# Patient Record
Sex: Female | Born: 1970 | Race: Asian | Hispanic: No | Marital: Married | Smoking: Never smoker
Health system: Southern US, Community
[De-identification: ages and names within clinical notes are randomized; demographics above are authoritative.]

## PROBLEM LIST (undated history)

## (undated) ENCOUNTER — Inpatient Hospital Stay (HOSPITAL_COMMUNITY): Payer: Self-pay

## (undated) DIAGNOSIS — R51 Headache: Secondary | ICD-10-CM

## (undated) DIAGNOSIS — R519 Headache, unspecified: Secondary | ICD-10-CM

## (undated) DIAGNOSIS — G5601 Carpal tunnel syndrome, right upper limb: Secondary | ICD-10-CM

## (undated) DIAGNOSIS — G4700 Insomnia, unspecified: Secondary | ICD-10-CM

## (undated) DIAGNOSIS — I10 Essential (primary) hypertension: Secondary | ICD-10-CM

## (undated) DIAGNOSIS — Z789 Other specified health status: Secondary | ICD-10-CM

## (undated) DIAGNOSIS — T7491XA Unspecified adult maltreatment, confirmed, initial encounter: Secondary | ICD-10-CM

## (undated) HISTORY — DX: Unspecified adult maltreatment, confirmed, initial encounter: T74.91XA

## (undated) HISTORY — DX: Insomnia, unspecified: G47.00

## (undated) HISTORY — DX: Headache: R51

## (undated) HISTORY — DX: Carpal tunnel syndrome, right upper limb: G56.01

## (undated) HISTORY — PX: NO PAST SURGERIES: SHX2092

---

## 2010-12-22 ENCOUNTER — Other Ambulatory Visit (HOSPITAL_COMMUNITY): Payer: Self-pay | Admitting: Internal Medicine

## 2010-12-22 DIAGNOSIS — Z1231 Encounter for screening mammogram for malignant neoplasm of breast: Secondary | ICD-10-CM

## 2012-04-20 ENCOUNTER — Encounter (HOSPITAL_COMMUNITY): Payer: Self-pay | Admitting: Nurse Practitioner

## 2012-04-20 ENCOUNTER — Emergency Department (HOSPITAL_COMMUNITY)
Admission: EM | Admit: 2012-04-20 | Discharge: 2012-04-20 | Disposition: A | Discharge status: Home / Self Care | Payer: Medicaid Other | Attending: Emergency Medicine | Admitting: Emergency Medicine

## 2012-04-20 ENCOUNTER — Emergency Department (HOSPITAL_COMMUNITY): Payer: Medicaid Other

## 2012-04-20 DIAGNOSIS — O2390 Unspecified genitourinary tract infection in pregnancy, unspecified trimester: Secondary | ICD-10-CM | POA: Insufficient documentation

## 2012-04-20 DIAGNOSIS — R630 Anorexia: Secondary | ICD-10-CM | POA: Insufficient documentation

## 2012-04-20 DIAGNOSIS — R109 Unspecified abdominal pain: Secondary | ICD-10-CM | POA: Insufficient documentation

## 2012-04-20 DIAGNOSIS — R300 Dysuria: Secondary | ICD-10-CM | POA: Insufficient documentation

## 2012-04-20 DIAGNOSIS — R51 Headache: Secondary | ICD-10-CM | POA: Insufficient documentation

## 2012-04-20 DIAGNOSIS — O0289 Other abnormal products of conception: Secondary | ICD-10-CM | POA: Insufficient documentation

## 2012-04-20 DIAGNOSIS — O210 Mild hyperemesis gravidarum: Secondary | ICD-10-CM | POA: Insufficient documentation

## 2012-04-20 DIAGNOSIS — O020 Blighted ovum and nonhydatidiform mole: Secondary | ICD-10-CM

## 2012-04-20 DIAGNOSIS — N390 Urinary tract infection, site not specified: Secondary | ICD-10-CM | POA: Insufficient documentation

## 2012-04-20 LAB — URINALYSIS, MICROSCOPIC ONLY
Glucose, UA: NEGATIVE mg/dL
Ketones, ur: NEGATIVE mg/dL
pH: 6.5 (ref 5.0–8.0)

## 2012-04-20 LAB — CBC WITH DIFFERENTIAL/PLATELET
Basophils Absolute: 0 10*3/uL (ref 0.0–0.1)
Basophils Relative: 0 % (ref 0–1)
Eosinophils Absolute: 0.1 10*3/uL (ref 0.0–0.7)
Eosinophils Relative: 2 % (ref 0–5)
MCH: 26 pg (ref 26.0–34.0)
MCHC: 34.7 g/dL (ref 30.0–36.0)
MCV: 75 fL — ABNORMAL LOW (ref 78.0–100.0)
Platelets: 249 10*3/uL (ref 150–400)
RDW: 13 % (ref 11.5–15.5)
WBC: 6.3 10*3/uL (ref 4.0–10.5)

## 2012-04-20 LAB — COMPREHENSIVE METABOLIC PANEL
AST: 18 U/L (ref 0–37)
Albumin: 4 g/dL (ref 3.5–5.2)
Chloride: 102 mEq/L (ref 96–112)
Creatinine, Ser: 0.69 mg/dL (ref 0.50–1.10)
Potassium: 3.8 mEq/L (ref 3.5–5.1)
Sodium: 137 mEq/L (ref 135–145)
Total Bilirubin: 0.3 mg/dL (ref 0.3–1.2)

## 2012-04-20 LAB — POCT I-STAT TROPONIN I: Troponin i, poc: 0 ng/mL (ref 0.00–0.08)

## 2012-04-20 MED ORDER — CEPHALEXIN 500 MG PO CAPS: 500.0000 mg | ORAL_CAPSULE | Freq: Four times a day (QID) | ORAL | Status: DC

## 2012-04-20 NOTE — ED Provider Notes (Signed)
Patient signed out to me by Dr. Anitra Lauth. She presents today with some lower abdominal pain and has a positive pregnancy test. Ultrasound shows sac consistent with blighted ovum. Patient speaks a burmese dialect for which we are unable to obtain a Nurse, learning disability. Patient's care was translated through telephone translator to a neighbor of hers who then translated into her local dialect. Patient was informed of the likelihood of his carriage. She is given instructions for followup at [facility] hospital. Discharge diagnosis blighted ovum with imminent miscarriage and urinary tract infection  Hilario Quarry, MD 04/20/12 [phone]

## 2012-04-20 NOTE — ED Provider Notes (Signed)
History     CSN: [id number]  Arrival date & time 04/20/12  1147   First MD Initiated Contact with Patient 04/20/12 1302      Chief Complaint  Patient presents with  . Headache  . Abdominal Pain    (Consider location/radiation/quality/duration/timing/severity/associated sxs/prior treatment) Patient is a 42 y.o. female presenting with abdominal pain. The history is provided by a friend. The history is limited by a language barrier. A language interpreter was used.  Abdominal Pain Pain location:  Suprapubic, R flank and L flank Pain quality: aching, heavy and throbbing   Pain radiates to:  L flank and R flank Pain severity:  Moderate Onset quality:  Gradual Duration:  3 days Timing:  Constant Progression:  Worsening Chronicity:  New Context: recent sexual activity   Context: not recent illness   Context comment:  LMP was appx  NOv 12,2013 Relieved by:  Nothing Worsened by:  Nothing tried Ineffective treatments:  None tried Associated symptoms: anorexia, dysuria and nausea   Associated symptoms: no cough, no diarrhea, no fever, no vaginal bleeding, no vaginal discharge and no vomiting     History reviewed. No pertinent past medical history.  History reviewed. No pertinent past surgical history.  History reviewed. No pertinent family history.  History  Substance Use Topics  . Smoking status: Never Smoker   . Smokeless tobacco: Not on file  . Alcohol Use: Yes     Comment: occassional    OB History   Grav Para Term Preterm Abortions TAB SAB Ect Mult Living                  Review of Systems  Constitutional: Negative for fever.  Respiratory: Negative for cough.   Gastrointestinal: Positive for nausea, abdominal pain and anorexia. Negative for vomiting and diarrhea.  Genitourinary: Positive for dysuria. Negative for vaginal bleeding and vaginal discharge.  Neurological: Positive for headaches.       Intermittent HA for the last few months that are non-specific   All other systems reviewed and are negative.    Allergies  Review of patient's allergies indicates no known allergies.  Home Medications  No current outpatient prescriptions on file.  BP 134/85  Pulse 81  Temp(Src) 98.3 F (36.8 C) (Oral)  Resp 15  SpO2 100%  Physical Exam  Nursing note and vitals reviewed. Constitutional: She is oriented to person, place, and time. She appears well-developed and well-nourished. No distress.  HENT:  Head: Normocephalic and atraumatic.  Mouth/Throat: Oropharynx is clear and moist.  Eyes: Conjunctivae and EOM are normal. Pupils are equal, round, and reactive to light.  Neck: Normal range of motion. Neck supple.  Cardiovascular: Normal rate, regular rhythm and intact distal pulses.   No murmur heard. Pulmonary/Chest: Effort normal and breath sounds normal. No respiratory distress. She has no wheezes. She has no rales.  Abdominal: Soft. Normal appearance. She exhibits no distension. There is tenderness in the suprapubic area. There is CVA tenderness. There is no rebound and no guarding.  Genitourinary: Uterus is enlarged. Cervix exhibits no motion tenderness and no friability. Right adnexum displays no mass, no tenderness and no fullness. Left adnexum displays no mass, no tenderness and no fullness. There is bleeding around the vagina.  Musculoskeletal: Normal range of motion. She exhibits no edema and no tenderness.  Neurological: She is alert and oriented to person, place, and time.  Skin: Skin is warm and dry. No rash noted. No erythema.  Psychiatric: She has a normal mood and affect.  Her behavior is normal.    ED Course  Procedures (including critical care time)  Labs Reviewed  WET PREP, GENITAL - Abnormal; Notable for the following:    WBC, Wet Prep HPF POC MODERATE (*)    All other components within normal limits  CBC WITH DIFFERENTIAL - Abnormal; Notable for the following:    MCV 75.0 (*)    All other components within normal limits   URINALYSIS, MICROSCOPIC ONLY - Abnormal; Notable for the following:    APPearance CLOUDY (*)    Hgb urine dipstick LARGE (*)    Leukocytes, UA MODERATE (*)    All other components within normal limits  HCG, QUANTITATIVE, PREGNANCY - Abnormal; Notable for the following:    hCG, Beta Chain, Quant, S 9619 (*)    All other components within normal limits  POCT PREGNANCY, URINE - Abnormal; Notable for the following:    Preg Test, Ur POSITIVE (*)    All other components within normal limits  GC/CHLAMYDIA PROBE AMP  COMPREHENSIVE METABOLIC PANEL  LIPASE, BLOOD  POCT I-STAT TROPONIN I  ABO/RH   No results found.   No diagnosis found.    MDM   Patient is not speaking worse and giving history through a third party because she does not speak the language interpreter phone is speaking however decent history obtained through third party. Patient has been lightheaded upon standing with suprapubic and bilateral back pain that started 3 days ago. States her last menstrual period was approximately November 12. She denies any vaginal discharge or itching but is sexually active. On exam patient has mild bilateral flank pain and suprapubic pain. Your pregnancy test is positive at bedside ultrasound a gestational sac can be seen but no yolk sac or fetal pole. Gestational sac was measuring 8 weeks and her bladder was emptied. Will send her for formal transvaginal ultrasound. Urine also appears to be infected which is most likely urinary tract infection causing her symptoms. Otherwise hemoglobin is stable and electrolytes are normal. HCG is pending. Patient is having no bleeding and no prior known Rh incompatibility.  Pt with some vaginal bleeding on exam but no CMT or adnexal pain.  RH sent.  Pelvic U/S pending.  3:48 PM  U/S pending.  Checked out to Dr. Rosalia Hammers at 1600.     Gwyneth Sprout, MD 04/20/12 1549

## 2012-04-20 NOTE — ED Notes (Signed)
Pt currently in [place].

## 2012-04-20 NOTE — ED Notes (Signed)
C/o headaches, L flank pain, vomiting for 2 months.

## 2012-04-20 NOTE — ED Notes (Signed)
Contacted [employer] interpreters for language assistance.  Pt informed of D/c instruction.  No distress noted.  Resp symmetrical and unlabored.  Skin warm and dry.  Pt informed to follow up with [facility] hospital.  Instructed to [facility] if her bleeding increases to 2 soaked pads an hour, if she begins to have fevers or chills, or if her Abd pain increases.  Pt verbalizes understanding.

## 2012-04-20 NOTE — ED Notes (Signed)
Non english speaking, family states they speak ?[place]?

## 2012-04-21 LAB — ABO/RH: ABO/RH(D): A POS

## 2012-04-23 LAB — GC/CHLAMYDIA PROBE AMP
CT Probe RNA: NEGATIVE
GC Probe RNA: NEGATIVE

## 2012-05-15 ENCOUNTER — Inpatient Hospital Stay (HOSPITAL_COMMUNITY): Payer: Medicaid Other

## 2012-05-15 ENCOUNTER — Inpatient Hospital Stay (HOSPITAL_COMMUNITY)
Admission: AD | Admit: 2012-05-15 | Discharge: 2012-05-15 | Disposition: A | Discharge status: Home / Self Care | Payer: Medicaid Other | Source: Ambulatory Visit | Attending: Family Medicine | Admitting: Family Medicine

## 2012-05-15 ENCOUNTER — Encounter (HOSPITAL_COMMUNITY): Payer: Self-pay | Admitting: Family

## 2012-05-15 DIAGNOSIS — O034 Incomplete spontaneous abortion without complication: Secondary | ICD-10-CM | POA: Insufficient documentation

## 2012-05-15 HISTORY — DX: Other specified health status: Z78.9

## 2012-05-15 LAB — CBC
Platelets: 224 10*3/uL (ref 150–400)
RDW: 14.3 % (ref 11.5–15.5)
WBC: 6.9 10*3/uL (ref 4.0–10.5)

## 2012-05-15 MED ORDER — MISOPROSTOL 200 MCG PO TABS
800.0000 ug | ORAL_TABLET | Freq: Once | ORAL | Status: AC
  Administered 2012-05-15: 800 ug via VAGINAL
  Filled 2012-05-15: qty 4

## 2012-05-15 NOTE — MAU Note (Signed)
Chief Complaint:  No chief complaint on file.  MAU patient contact at 1300.   HPI: [name] is a 42 y.o. non-English speaker who presents to maternity admissions for a follow up from [facility] ED visit on 04/20/12. Was seen for abdominal pain and was found to have blighted ovum at [redacted] weeks gestation. Came here today because she has been experiencing vaginal bleeding at home and thinks she has passed the pregnancy.   Today, denies vaginal bleeding, abdominal pain, fever, chills, n/v, or diarrhea.   History obtained using phone translator    Past Medical History: Past Medical History  Diagnosis Date  . Medical history non-contributory     Past obstetric history: OB History   Grav Para Term Preterm Abortions TAB SAB Ect Mult Living   4              # Outc Date GA Lbr Len/2nd Wgt Sex Del Anes PTL Lv   1 GRA            2 GRA            3 GRA            4 CUR               Past Surgical History: Past Surgical History  Procedure Laterality Date  . No past surgeries      Family History: History reviewed. No pertinent family history.  Social History: History  Substance Use Topics  . Smoking status: Never Smoker   . Smokeless tobacco: Never Used  . Alcohol Use: Yes     Comment: occassional    Allergies: No Known Allergies  Meds:  Prescriptions prior to admission  Medication Sig Dispense Refill  . cephALEXin (KEFLEX) 500 MG capsule Take 1 capsule (500 mg total) by mouth 4 (four) times daily.  40 capsule  0    ROS: Pertinent findings in history of present illness.  Physical Exam  Blood pressure 113/81, pulse 109, temperature 98.2 F (36.8 C), temperature source Oral, resp. rate 16, height 5\' 2"  (1.575 m), weight 58.06 kg (128 lb), last menstrual period 01/30/2012, SpO2 100.00%. GENERAL: Well-developed, well-nourished female in no acute distress.  HEENT: normocephalic HEART: normal rate RESP: normal effort ABDOMEN: Soft, non-tender, gravid appropriate for gestational  age EXTREMITIES: Nontender, no edema NEURO: alert and oriented       Labs: Results for orders placed during the hospital encounter of 05/15/12 (from the past 24 hour(s))  HCG, QUANTITATIVE, PREGNANCY     Status: Abnormal   Collection Time    05/15/12 11:33 AM      Result Value Range   hCG, Beta Chain, Quant, S 344 (*) <5 mIU/mL  CBC     Status: Abnormal   Collection Time    05/15/12 11:34 AM      Result Value Range   WBC 6.9  4.0 - 10.5 K/uL   RBC 5.06  3.87 - 5.11 MIL/uL   Hemoglobin 12.9  12.0 - 15.0 g/dL   HCT 16.1  09.6 - 04.5 %   MCV 76.1 (*) 78.0 - 100.0 fL   MCH 25.5 (*) 26.0 - 34.0 pg   MCHC 33.5  30.0 - 36.0 g/dL   RDW 40.9  81.1 - 91.4 %   Platelets 224  150 - 400 K/uL    Imaging:    ED Course CBC and quant hcg drawn, U/S done.  Consulted with Dr Shawnie Pons  Assessment: 1. Incomplete miscarriage  Plan: Cytotec  PV while in MAU Follow up in GYN clinic in 2 weeks-appt made for pt for April 2nd at 1:15pm. Bleeding precautions Discharge home     Medication List    ASK your doctor about these medications       cephALEXin 500 MG capsule  Commonly known as:  KEFLEX  Take 1 capsule (500 mg total) by mouth 4 (four) times daily.        Wilber Oliphant, student midwife I supervised the student and agree with the exam and documentation.  Nolene Bernheim RN, FNP 05/15/2012 1:00 PM

## 2012-05-15 NOTE — MAU Note (Signed)
Patient was seen at [facility] ED on 2-22.

## 2012-05-15 NOTE — MAU Note (Signed)
Patient presents to MAU with c/o not feeling well after possible miscarriage on 3/6. States bleeding ended then and has not been feeling well since.  Reports nausea in mornings; denies current vaginal bleeding or discharge.

## 2012-05-15 NOTE — MAU Note (Signed)
[employer] translator [id number]. Patient states she had a miscarriage in January but was not confirmed by hospital . States a MD in a clinic confirmed pregnancy. Was unable to get to the hospital during that time. Since that time has not been feeling well. No pain or bleeding at this time.

## 2012-05-29 ENCOUNTER — Ambulatory Visit (INDEPENDENT_AMBULATORY_CARE_PROVIDER_SITE_OTHER): Payer: Medicaid Other | Admitting: Medical

## 2012-05-29 ENCOUNTER — Encounter: Payer: Self-pay | Admitting: Medical

## 2012-05-29 VITALS — BP 108/72 | HR 59 | Temp 98.7°F | Ht 62.0 in | Wt 130.2 lb

## 2012-05-29 DIAGNOSIS — O039 Complete or unspecified spontaneous abortion without complication: Secondary | ICD-10-CM

## 2012-05-29 NOTE — Patient Instructions (Signed)
Miscarriage  A miscarriage is the loss of an unborn baby (fetus) before the 20th week of pregnancy. The cause is often unknown.   HOME CARE   You may need to stay in bed (bed rest), or you may be able to do light activity. Go about activity as told by your doctor.   Have help at home.   Write down how many pads you use each day. Write down how soaked they are.   Do not use tampons. Do not wash out your vagina (douche) or have sex (intercourse) until your doctor approves.   Only take medicine as told by your doctor.   Do not take aspirin.   Keep all doctor visits as told.   If you or your partner have problems with grieving, talk to your doctor. You can also try counseling. Give yourself time to grieve before trying to get pregnant again.  GET HELP RIGHT AWAY IF:   You have bad cramps or pain in your back or belly (abdomen).   You have a fever.   You pass large clumps of blood (clots) from your vagina that are walnut-sized or larger. Save the clumps for your doctor to see.   You pass large amounts of tissue from your vagina. Save the tissue for your doctor to see.   You have more bleeding.   You have thick, bad-smelling fluid (discharge) coming from the vagina.   You get lightheaded, weak, or you pass out (faint).   You have chills.  MAKE SURE YOU:   Understand these instructions.   Will watch your condition.   Will get help right away if you are not doing well or get worse.  Document Released: 05/08/2011 Document Reviewed: 05/08/2011  [employer] Patient Information 2013 [employer].

## 2012-05-29 NOTE — Progress Notes (Signed)
Patient ID: [name], female   DOB: [birth year], 42 y.o.   MRN: [id number]  History:  [name]  is a 42 y.o. [phone] who presents to clinic today for follow-up after SAB. The patient was seen in MAU on 05/15/12 for SAB and given cytotec. She states that she did not have that much bleeding afterwards, but is still having just a little bit today. She is also still having a little bit of pelvic pain. She is not interested in birth control at this time as she states that "her husband doesn't like birth control."   The following portions of the patient's history were reviewed and updated as appropriate: allergies, current medications, past family history, past medical history, past social history, past surgical history and problem list.  Review of Systems:  Pertinent items are noted in HPI.  Objective:  Physical Exam BP 108/72  Pulse 59  Temp(Src) 98.7 F (37.1 C) (Oral)  Ht 5\' 2"  (1.575 m)  Wt 130 lb 3.2 oz (59.058 kg)  BMI 23.81 kg/m2  LMP 01/30/2012  Breastfeeding? Unknown GENERAL: Well-developed, well-nourished female in no acute distress.  HEENT: Normocephalic, atraumatic.   LUNGS: Normal rate. Clear to auscultation bilaterally.  HEART: Regular rate and rhythm with no adventitious sounds.  ABDOMEN: Soft, mild lower abdominal tenderness to palpation, nondistended. No organomegaly. Normal bowel sounds appreciated in all quadrants.  PELVIC: Normal external female genitalia. Vagina is pink and rugated.  Normal discharge and scant blood at the cervical os. Normal cervix contour. Uterus is normal in size. No adnexal masses. Mild tenderness to palpation of the left suprapubic region.  EXTREMITIES: No cyanosis, clubbing, or edema, 2+ distal pulses.  Labs and Imaging Quant hCG today  Assessment & Plan:  Assessment: SAB s/p Cytotec  Plans: Quant hCG today Patient cautioned about becoming pregnant too quickly, declines birth control.  Patient advised on the importance of condoms. Patient  voices understanding.  Patient will be called if quant hCG is not <5 for follow-up labs in one week. Otherwise patient may return PRN.

## 2012-05-30 ENCOUNTER — Telehealth: Payer: Self-pay | Admitting: *Deleted

## 2012-05-30 LAB — HCG, QUANTITATIVE, PREGNANCY: hCG, Beta Chain, Quant, S: 42.5 m[IU]/mL

## 2012-05-30 NOTE — Telephone Encounter (Signed)
Need to call patient with [facility] interpreter- needs next bhcg 06/05/12.

## 2012-05-30 NOTE — Telephone Encounter (Signed)
Called Kelia with [employer] [phone] and left a message we are calling- please call clinic.

## 2012-05-30 NOTE — Telephone Encounter (Signed)
Message copied by Gerome Apley on Thu May 30, 2012  4:01 PM ------      Message from: Freddi Starr      Created: Thu May 30, 2012  1:20 PM       Please inform patient that pregnancy hormone levels are not back to normal yet. She will need to return to clinic for labs only in 1 week. (repeat bhCG). Thanks! ------

## 2012-06-05 NOTE — Telephone Encounter (Signed)
Called patient with [employer]preter [phone] and left  A message we are calling from [facility] clinics and need to make an appointment- please call clinic.

## 2012-06-05 NOTE — Telephone Encounter (Signed)
Called pt with [facility] interpreter [phone]. Message was left that I am calling with test results and appt needed. Please call the clinic.

## 2012-06-10 NOTE — Telephone Encounter (Signed)
Called pt with [employer] interpreter # [phone] and left message that this is our fifth attempt to please give [place] a call at the clinics.   Sent certified letter.

## 2012-07-08 ENCOUNTER — Other Ambulatory Visit: Payer: Medicaid Other

## 2012-07-08 DIAGNOSIS — O039 Complete or unspecified spontaneous abortion without complication: Secondary | ICD-10-CM

## 2012-07-08 LAB — HCG, QUANTITATIVE, PREGNANCY: hCG, Beta Chain, Quant, S: <2 m[IU]/mL

## 2012-08-23 ENCOUNTER — Emergency Department (HOSPITAL_COMMUNITY)
Admission: EM | Admit: 2012-08-23 | Discharge: 2012-08-23 | Disposition: A | Discharge status: Home / Self Care | Payer: Medicaid Other | Attending: Emergency Medicine | Admitting: Emergency Medicine

## 2012-08-23 ENCOUNTER — Encounter (HOSPITAL_COMMUNITY): Payer: Self-pay | Admitting: Family Medicine

## 2012-08-23 DIAGNOSIS — L509 Urticaria, unspecified: Secondary | ICD-10-CM | POA: Insufficient documentation

## 2012-08-23 DIAGNOSIS — R109 Unspecified abdominal pain: Secondary | ICD-10-CM | POA: Insufficient documentation

## 2012-08-23 MED ORDER — ACETAMINOPHEN 325 MG PO TABS
975.0000 mg | ORAL_TABLET | Freq: Once | ORAL | Status: AC
  Administered 2012-08-23: 975 mg via ORAL
  Filled 2012-08-23: qty 3
  Filled 2012-08-23: qty 1

## 2012-08-23 MED ORDER — HYDROCORTISONE 1 % EX CREA: TOPICAL_CREAM | CUTANEOUS | Status: DC

## 2012-08-23 NOTE — ED Notes (Signed)
Per pt family she has rash all over her body that is itching and has been having abdominal pain and back pain. Denies N,V,D

## 2012-08-23 NOTE — ED Provider Notes (Signed)
History    CSN: [id number] Arrival date & time 08/23/12  1135  First MD Initiated Contact with Patient 08/23/12 1232     Chief Complaint  Patient presents with  . Pruritis  . Abdominal Pain   (Consider location/radiation/quality/duration/timing/severity/associated sxs/prior Treatment) HPI  42 y/o [place] Female from [place]. She speaks no english. Native language is Shanda Howells and [place] interpreter is used. The interpreter has difficulty translating. He states that he is having to ask the same question several times because the patient changes the subject or does not answer the question. 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 Patient cc is rash. She states it comes and goes. It is itchy and appears in different parts of her body at different times. The patient is unsure if she has changed her lotions, soaps or detergents. She does not know if anyone else in the house has the same rash.  She denies bedbugs or insects in the hourse. She denies swelling in the tongue, itching in the throat, wheezing or difficulty breathing. She has no known exacerbating factors.She has a slight headache at the moment. Denies photophobia, phonophobia, UL throbbing, N/V, visual changes, stiff neck, neck pain, rash, or "thunderclap" onset.    Past Medical History  Diagnosis Date  . Medical history non-contributory    Past Surgical History  Procedure Laterality Date  . No past surgeries     History reviewed. No pertinent family history. History  Substance Use Topics  . Smoking status: Never Smoker   . Smokeless tobacco: Never Used  . Alcohol Use: Yes     Comment: occassional   OB History   Grav Para Term Preterm Abortions TAB SAB Ect Mult Living   4 3 3       3      Review of Systems As stated in the HPI Allergies  Review of patient's allergies indicates no known allergies.  Home Medications  No current outpatient prescriptions on file. BP 115/85  Pulse 74  Temp(Src) 98.2 F  (36.8 C) (Oral)  Resp 16  SpO2 98%  LMP 01/30/2012 Physical Exam Physical Exam  Nursing note and vitals reviewed. Constitutional: She is oriented to person, place, and time. She appears well-developed and well-nourished. No distress.  HENT:  Head: Normocephalic and atraumatic.  Eyes: Conjunctivae normal and EOM are normal. Pupils are equal, round, and reactive to light. No scleral icterus.  Neck: Normal range of motion.  Cardiovascular: Normal rate, regular rhythm and normal heart sounds.  Exam reveals no gallop and no friction rub.   No murmur heard. Pulmonary/Chest: Effort normal and breath sounds normal. No respiratory distress.  Abdominal: Soft. Bowel sounds are normal. She exhibits no distension and no mass. There is no tenderness. There is no guarding.  Neurological: She is alert and oriented to person, place, and time.  Skin: Skin is warm and dry. She is not diaphoretic.  Urticaria of the inner thighs and legs. Appears to be in the places where her pants touch .   ED Course  Procedures (including critical care time) Labs Reviewed - No data to display No results found. 1. Hives     MDM  3:31 PM BP 115/85  Pulse 59  Temp(Src) 97.5 F (36.4 C) (Oral)  Resp 16  SpO2 99%  LMP 01/30/2012 Patient with hives. Mild headache treated with tylenol. No focal neuro deficits. Patient will be discharged with cortisone cream. I have written instructions for the patient to present to the pharmacist in order  to ensure that she gets dye free, perfume free products. The patient appears reasonably screened and/or stabilized for discharge and I doubt any other medical condition or other [facility] requiring further screening, evaluation, or treatment in the ED at this time prior to discharge.   Arthor Captain, PA-C 08/23/12 1543

## 2012-08-24 NOTE — ED Provider Notes (Signed)
Medical screening examination/treatment/procedure(s) were performed by non-physician practitioner and as supervising physician I was immediately available for consultation/collaboration.   Joya Gaskins, MD 08/24/12 [phone]

## 2013-03-11 ENCOUNTER — Emergency Department (HOSPITAL_COMMUNITY): Payer: Medicaid Other

## 2013-03-11 ENCOUNTER — Emergency Department (HOSPITAL_COMMUNITY)
Admission: EM | Admit: 2013-03-11 | Discharge: 2013-03-11 | Disposition: A | Discharge status: Home / Self Care | Payer: Medicaid Other | Attending: Emergency Medicine | Admitting: Emergency Medicine

## 2013-03-11 ENCOUNTER — Encounter (HOSPITAL_COMMUNITY): Payer: Self-pay | Admitting: Emergency Medicine

## 2013-03-11 DIAGNOSIS — S0993XA Unspecified injury of face, initial encounter: Secondary | ICD-10-CM | POA: Insufficient documentation

## 2013-03-11 DIAGNOSIS — Y9301 Activity, walking, marching and hiking: Secondary | ICD-10-CM | POA: Insufficient documentation

## 2013-03-11 DIAGNOSIS — S0990XA Unspecified injury of head, initial encounter: Secondary | ICD-10-CM | POA: Insufficient documentation

## 2013-03-11 DIAGNOSIS — S199XXA Unspecified injury of neck, initial encounter: Secondary | ICD-10-CM

## 2013-03-11 DIAGNOSIS — IMO0002 Reserved for concepts with insufficient information to code with codable children: Secondary | ICD-10-CM | POA: Insufficient documentation

## 2013-03-11 DIAGNOSIS — R002 Palpitations: Secondary | ICD-10-CM | POA: Insufficient documentation

## 2013-03-11 DIAGNOSIS — Z3202 Encounter for pregnancy test, result negative: Secondary | ICD-10-CM | POA: Insufficient documentation

## 2013-03-11 DIAGNOSIS — Y92410 Unspecified street and highway as the place of occurrence of the external cause: Secondary | ICD-10-CM | POA: Insufficient documentation

## 2013-03-11 DIAGNOSIS — S8392XA Sprain of unspecified site of left knee, initial encounter: Secondary | ICD-10-CM

## 2013-03-11 LAB — TROPONIN I
Troponin I: <0.3 ng/mL (ref <0.30)
Troponin I: <0.3 ng/mL (ref <0.30)

## 2013-03-11 LAB — POCT PREGNANCY, URINE: Preg Test, Ur: NEGATIVE

## 2013-03-11 MED ORDER — IBUPROFEN 800 MG PO TABS: 800.0000 mg | ORAL_TABLET | Freq: Three times a day (TID) | ORAL | Status: DC

## 2013-03-11 NOTE — Progress Notes (Signed)
Pt. Speaks no english, negative urine pregnancy test 03-11-13
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
## 2013-03-11 NOTE — Discharge Instructions (Signed)
Please call your doctor for a followup appointment within 24-48 hours. When you talk to your doctor please let them know that you were seen in the emergency department and have them acquire all of your records so that they can discuss the findings with you and formulate a treatment plan to fully care for your new and ongoing problems. Please call and set-up an appointment with neurosurgery and orthopedics to be re-evaluated - will need MRI in the near future if your back pain continues No broken bones on xrays or CT scans Please rest and stay hydrated Please take medications as prescribed Please avloid any physical or strenuous activity Please continue to monitor symptoms and if symptoms are to worsen or change (fever greater than 101, chills, sweating, chest pain, neck pain, shortness of breath, difficulty breathing, abdominal pain, numbness, tingling, fall, injury, inability to walk, headache, confusion, visual changes) please report back to the ED  Knee Pain Knee pain can be a result of an injury or other medical conditions. Treatment will depend on the cause of your pain. HOME CARE  Only take medicine as told by your doctor.  Keep a healthy weight. Being overweight can make the knee hurt more.  Stretch before exercising or playing sports.  If there is constant knee pain, change the way you exercise. Ask your doctor for advice.  Make sure shoes fit well. Choose the right shoe for the sport or activity.  Protect your knees. Wear kneepads if needed.  Rest when you are tired. GET HELP RIGHT AWAY IF:   Your knee pain does not stop.  Your knee pain does not get better.  Your knee joint feels hot to the touch.  You have a fever. MAKE SURE YOU:   Understand these instructions.  Will watch this condition.  Will get help right away if you are not doing well or get worse. Document Released: 05/12/2008 Document Revised: 05/08/2011 Document Reviewed: 05/12/2008 [facility] Patient  Information 2014 [employer] [place].   Emergency Department Resource Guide 1) Find a Doctor and Pay Out of Pocket Although you won't have to find out who is covered by your insurance plan, it is a good idea to ask around and get recommendations. You will then need to call the office and see if the doctor you have chosen will accept you as a new patient and what types of options they offer for patients who are self-pay. Some doctors offer discounts or will set up payment plans for their patients who do not have insurance, but you will need to ask so you aren't surprised when you get to your appointment.  2) Contact Your Local Health Department Not all health departments have doctors that can see patients for sick visits, but many do, so it is worth a call to see if yours does. If you don't know where your local health department is, you can check in your phone book. The CDC also has a tool to help you locate your state's health department, and many state websites also have listings of all of their local health departments.  3) Find a Walk-in Clinic If your illness is not likely to be very severe or complicated, you may want to try a walk in clinic. These are popping up all over the country in pharmacies, drugstores, and shopping centers. They're usually staffed by nurse practitioners or physician assistants that have been trained to treat common illnesses and complaints. They're usually fairly quick and inexpensive. However, if you have serious medical issues  or chronic medical problems, these are probably not your best option.  No Primary Care Doctor: - Call Health Connect at  [phone] - they can help you locate a primary care doctor that  accepts your insurance, provides certain services, etc. - Physician Referral Service- [phone]  Chronic Pain Problems: Organization         Address  Phone   Notes  [facility]  [phone] Patients need to be referred by their primary  care doctor.   Medication Assistance: Organization         Address  Phone   Notes  [facility] Medication [facility] [address] [place] [place], [place] [address] [phone] --Must be a resident of [facility] -- Must have NO insurance coverage whatsoever (no Medicaid/ Medicare, etc.) -- The pt. MUST have a primary care doctor that directs their care regularly and follows them in the community   [facility]ist  [phone]   [employer]  [phone]    Agencies that provide inexpensive medical care: Organization         Address  Phone   Notes  [facility]  [phone]   [facility]    [phone]   [facility] [address] [place], [place] [address] [phone]   [facility] [address] [place] [place] [phone]   Planned Parenthood    [phone]   [facility]    [phone]   [facility]  [address], [place] Phone:  [phone], Fax:  [phone] Hours of Operation:  9 am - 6 pm, M-F.  Also accepts Medicaid/Medicare and self-pay.  [facility]  [address], [place] Phone: [phone], Fax: [phone]. Hours of Operation:  8:30 am - 5:30 pm, M-F.  Also accepts Medicaid and self-pay.  [facility] [place] [address], [place] Phone: [phone]   [facility] [address] Natasha Bence [place], [place] [phone], Ext. [phone] Mondays & Thursdays: 7-9 AM.  First 15 patients are seen on a first come, first serve basis.    Medicaid-accepting [facility] Providers:  Organization         Address  Phone   Notes  [facility] [address], [place] [phone] Also accepts self-pay patients.  [facility] [address] Laurell Josephs [place], [place]  [phone]   [facility] [address], [place] [phone]   [facility] [address], [place] [phone]   Renaye Rakers [address], [place]   [phone] Only accepts [place] Access [place] patients after they have their name applied to their card.   Self-Pay (no insurance) in [facility]:  Organization         Address  Phone   Notes  Sickle Cell Patients, [facility] [address] [place], [place] [phone]   [facility] [address] [place], [place] [phone]   [facility] [place]  [address],  [phone]   [facility]/Dr. Osei-Bonsu  [address], [place] or [address], [place] [phone] Phone number for both [place] and [place] locations is the same.  [facility] [address], [place] [phone]   [facility] [place] [address] [place] [address], [place] or [address] [phone]) [phone]   [facility] [address], [place] [phone]) [phone], phone; [phone](336) [phone], fax Sees patients 1st and 3rd Saturday of every month.  Must not qualify for public or private insurance (i.e. Medicaid, Medicare, [place] Health Choice, Veterans' Benefits)  Household income should be no more than 200% of the poverty level The clinic cannot treat you if you are pregnant or think you are pregnant  Sexually transmitted diseases are not treated at the clinic.    Dental Care: Organization         Address  Phone  Notes  [facility] [facility] [address], [place] [phone] Accepts children up to age 44 who are enrolled in [place] or [place] Health Choice; pregnant women with a Medicaid card; and children who have applied for Medicaid or [place] Health Choice, but were declined, whose parents can pay a reduced fee at  time of service.  [facility] [place]  [address], [place] [phone] Accepts children up to age 44 who are enrolled in [place] or [place] Health Choice; pregnant women with a Medicaid card; and children who have applied for Medicaid or [place] Health Choice, but were declined, whose parents can pay a reduced fee at time of service.  [facility]  [address], [place] [phone]) [phone] Patients are seen by appointment only. Walk-ins are not accepted. [facility] will see patients 44 years of age and older. Monday - Tuesday (8am-5pm) Most Wednesdays (8:30-5pm) $30 per visit, cash only  [facility]  [address], [facility] [phone]) [phone] Patients are seen by appointment only. Walk-ins are not accepted. [facility] will see patients 44 years of age and older. One Wednesday Evening (Monthly: Volunteer Based).  $30 per visit, cash only  [employer] [facility]  [phone] for adults; Children under age 454, call Graduate Pediatric Dentistry at [phone]. Children aged 414-14, please call [phone] to request a pediatric application.  Dental services are provided in all areas of dental care including fillings, crowns and bridges, complete and partial dentures, implants, gum treatment, root canals, and extractions. Preventive care is also provided. Treatment is provided to both adults and children. Patients are selected via a lottery and there is often a waiting list.   [facility] [address], [place]  [phone] [email or web address]   [facility] [address], [place], [place] [phone], Ext. [phone] Second and Fourth Thursday of each month, opens at 6:30 AM; Clinic ends at 9 AM.  Patients are seen on a first-come first-served basis, and a limited number are seen during each clinic.   [facility]  [address], [place]  [place], [place] [phone]   Eligibility Requirements You must have lived in [place], [place], or [place] counties for at least the last three months.   You cannot be eligible for state or federal sponsored [place] Cityhealthcare insurance, including CIGNAVeterans Administration, [place]icaid, or [employer]Medicare.   You generally cannot be eligible for healthcare insurance through your employer.    How to apply: Eligibility screenings are held every Tuesday and Wednesday afternoon from 1:00 pm until 4:00 pm. You do not need an appointment for the interview!  [facility] [address], [place], [place] [phone]   [facility] Health Department  [phone]   [facility] Health [facility]  [phone]   [facility] Health Department  [phone]    Behavioral Health Resources in the Community: Intensive Outpatient Programs Organization         Address  Phone  Notes  [facility] Services [address], [place], [place] [phone]   [facility] Outpatient [address], [place], [place] [phone]   [facility] [address] [place], [place]  [phone]   [facility] [address]  [place], [place] [phone] or [phone]   Substance Abuse Resources Organization         Address  Phone  Notes  Al[facility] Drug Services  [phone]   [facility]  ([phone]   [facility] [place]  [phone]   Floydene Flock  [phone]   Residential & Outpatient Substance Abuse Program  [phone]   Psychological Services Organization         Address  Phone  Notes  [facility] Health  [phone]   [facility]  [phone]   [facility] [address], [place] [phone] or [phone]    Mobile Crisis Teams Organization         Address  Phone  Notes  [facility], Mobile Crisis Care Unit  [phone]   [facility] Psychotherapeutic  Services  [address] [place], [place] [phone]   Doristine Locks [address] [place] [phone]    Self-Help/Support Groups Organization         Address  Phone             Notes  [facility] [place] - variety of support groups  336- I7437963 Call for more information  Narcotics Anonymous (NA), [facility] [address], [employer] [place]  2 meetings at this location   Statistician         Address  Phone  Notes  [facility] [address],    [place]  [phone]   [facility]  [address], [place] [address], [place], [place] [phone]   [facility] [address] [place], [place] [phone] Admissions: 8am-3pm M-F  [facility] [address],    [place], [place] [phone]   [facility] [address] [place], [place], [place] [phone]   [facility] [address],  [place], [place] [phone]   [facility] - Intensive Outpatient [address] Laurell Joseph[address], [place], [place] [phone]   [facility] ([facility] [address] [place].,  [place], [place] [phone] or [phone]   [facility] ([facility]) [address] [place], [place] [phone] Accepts Medicaid  [facility] [address]  [place] [phone] Substance Abuse/Addiction Treatment   [facility] Organization         Address  Phone  Notes  [facility]  [phone]   Angie Fava, PhD [address] Ervin Knack [place], [place]   [phone] or [phone]   [facility]   [address] [place], [place] [phone]   [facility] [address], [place], [place] [phone] Insurance/Medicaid/sponsorship through [employer] and Families [address]  [place], [place] [phone] Therapy/tele-psych/case  °[facility] [address]  ° [place], [place] [phone]    °Dr. Arfeen  ([phone]   °[facility] [place]  [employer] [place] [facility] 1) [address], [place] °2) [address], [place] °3)  [address], [place] [phone]) [phone] °([phone]) [phone] ° °([phone]) [phone]   °[place] Child Abuse Hotline (336) 342-1394 or (336) [phone] (After Hours)    ° ° ° °

## 2013-03-11 NOTE — ED Notes (Signed)
Patient transported to CT 

## 2013-03-13 NOTE — ED Provider Notes (Signed)
Medical screening examination/treatment/procedure(s) were performed by non-physician practitioner and as supervising physician I was immediately available for consultation/collaboration.  EKG Interpretation    Date/Time:  Tuesday March 11 2013 10:49:14 EST Ventricular Rate:  74 PR Interval:  140 QRS Duration: 85 QT Interval:  383 QTC Calculation: 425 R Axis:   67 Text Interpretation:  Sinus rhythm non-specific t wave changes in V2-V3 Confirmed by Shaneque Merkle  MD, Anasia Agro ([id number]) on 03/11/2013 10:53:11 AM              Randa SpikeForrest Mort SawyersS Kimari Coudriet, MD 03/13/13 1316

## 2013-07-28 ENCOUNTER — Emergency Department (HOSPITAL_COMMUNITY): Payer: Medicaid Other

## 2013-07-28 ENCOUNTER — Emergency Department (HOSPITAL_COMMUNITY)
Admission: EM | Admit: 2013-07-28 | Discharge: 2013-07-28 | Disposition: A | Discharge status: Home / Self Care | Payer: Medicaid Other | Attending: Emergency Medicine | Admitting: Emergency Medicine

## 2013-07-28 ENCOUNTER — Encounter (HOSPITAL_COMMUNITY): Payer: Self-pay | Admitting: Emergency Medicine

## 2013-07-28 DIAGNOSIS — R42 Dizziness and giddiness: Secondary | ICD-10-CM | POA: Insufficient documentation

## 2013-07-28 DIAGNOSIS — O2390 Unspecified genitourinary tract infection in pregnancy, unspecified trimester: Secondary | ICD-10-CM | POA: Insufficient documentation

## 2013-07-28 DIAGNOSIS — R5383 Other fatigue: Secondary | ICD-10-CM

## 2013-07-28 DIAGNOSIS — N390 Urinary tract infection, site not specified: Secondary | ICD-10-CM | POA: Insufficient documentation

## 2013-07-28 DIAGNOSIS — R5381 Other malaise: Secondary | ICD-10-CM | POA: Insufficient documentation

## 2013-07-28 DIAGNOSIS — O2690 Pregnancy related conditions, unspecified, unspecified trimester: Secondary | ICD-10-CM | POA: Insufficient documentation

## 2013-07-28 DIAGNOSIS — O200 Threatened abortion: Secondary | ICD-10-CM | POA: Insufficient documentation

## 2013-07-28 DIAGNOSIS — O09529 Supervision of elderly multigravida, unspecified trimester: Secondary | ICD-10-CM | POA: Insufficient documentation

## 2013-07-28 HISTORY — DX: Headache, unspecified: R51.9

## 2013-07-28 HISTORY — DX: Headache: R51

## 2013-07-28 LAB — TYPE AND SCREEN
ABO/RH(D): A POS
ANTIBODY SCREEN: POSITIVE
DAT, IgG: NEGATIVE

## 2013-07-28 LAB — CBC WITH DIFFERENTIAL/PLATELET
Basophils Absolute: 0 10*3/uL (ref 0.0–0.1)
Basophils Relative: 0 % (ref 0–1)
EOS ABS: 0.2 10*3/uL (ref 0.0–0.7)
EOS PCT: 3 % (ref 0–5)
HCT: 36.2 % (ref 36.0–46.0)
HEMOGLOBIN: 12.4 g/dL (ref 12.0–15.0)
LYMPHS ABS: 1.3 10*3/uL (ref 0.7–4.0)
Lymphocytes Relative: 27 % (ref 12–46)
MCH: 26.2 pg (ref 26.0–34.0)
MCHC: 34.3 g/dL (ref 30.0–36.0)
MCV: 76.5 fL — AB (ref 78.0–100.0)
MONOS PCT: 7 % (ref 3–12)
Monocytes Absolute: 0.3 10*3/uL (ref 0.1–1.0)
Neutro Abs: 3.1 10*3/uL (ref 1.7–7.7)
Neutrophils Relative %: 63 % (ref 43–77)
Platelets: 187 10*3/uL (ref 150–400)
RBC: 4.73 MIL/uL (ref 3.87–5.11)
RDW: 13.1 % (ref 11.5–15.5)
WBC: 4.9 10*3/uL (ref 4.0–10.5)

## 2013-07-28 LAB — URINE MICROSCOPIC-ADD ON

## 2013-07-28 LAB — URINALYSIS, ROUTINE W REFLEX MICROSCOPIC
BILIRUBIN URINE: NEGATIVE
Glucose, UA: NEGATIVE mg/dL
Ketones, ur: NEGATIVE mg/dL
Nitrite: NEGATIVE
PH: 7 (ref 5.0–8.0)
Protein, ur: NEGATIVE mg/dL
SPECIFIC GRAVITY, URINE: 1.009 (ref 1.005–1.030)
Urobilinogen, UA: 0.2 mg/dL (ref 0.0–1.0)

## 2013-07-28 LAB — POC URINE PREG, ED: Preg Test, Ur: POSITIVE — AB

## 2013-07-28 LAB — COMPREHENSIVE METABOLIC PANEL
ALT: 17 U/L (ref 0–35)
AST: 20 U/L (ref 0–37)
Albumin: 3.7 g/dL (ref 3.5–5.2)
Alkaline Phosphatase: 29 U/L — ABNORMAL LOW (ref 39–117)
BUN: 10 mg/dL (ref 6–23)
CO2: 24 mEq/L (ref 19–32)
Calcium: 8.8 mg/dL (ref 8.4–10.5)
Chloride: 103 mEq/L (ref 96–112)
Creatinine, Ser: 0.83 mg/dL (ref 0.50–1.10)
GFR calc non Af Amer: 85 mL/min — ABNORMAL LOW (ref >90)
Glucose, Bld: 96 mg/dL (ref 70–99)
Potassium: 3.9 mEq/L (ref 3.7–5.3)
Sodium: 140 mEq/L (ref 137–147)
TOTAL PROTEIN: 7.4 g/dL (ref 6.0–8.3)
Total Bilirubin: 0.3 mg/dL (ref 0.3–1.2)

## 2013-07-28 LAB — RPR

## 2013-07-28 LAB — HCG, QUANTITATIVE, PREGNANCY: HCG, BETA CHAIN, QUANT, S: 1975 m[IU]/mL — AB (ref <5)

## 2013-07-28 LAB — I-STAT CG4 LACTIC ACID, ED: Lactic Acid, Venous: 1.73 mmol/L (ref 0.5–2.2)

## 2013-07-28 MED ORDER — NITROFURANTOIN MONOHYD MACRO 100 MG PO CAPS: 100.0000 mg | ORAL_CAPSULE | Freq: Two times a day (BID) | ORAL | Status: DC

## 2013-07-28 NOTE — ED Provider Notes (Signed)
I saw and evaluated the patient, reviewed the resident's note and I agree with the findings and plan.   EKG Interpretation None      Patient presents with left lower quadrant pain. Last period 2 months ago. Patient and she may be pregnant. Denies any vaginal bleeding. Hemodynamically stable without evidence of syncope.   Beta almost 2000.  [place] without ectopic but with irregular gestational sac.  May be evolving MC vs early pregnancy.  Bleeding with closed os on resident's pelvic exam. Patient needs repeat quant and [place].  Interpreter used to share diagnosis and followup plan with the patient.  Continues to be stable.  Will d/c with follow-up.  Shon Baton, MD 07/28/13 [phone]

## 2013-07-28 NOTE — ED Provider Notes (Signed)
I saw and evaluated the patient, reviewed the resident's note and I agree with the findings and plan.   EKG Interpretation None        Shon Baton, MD 07/28/13 1943

## 2013-07-28 NOTE — ED Provider Notes (Signed)
CSN: [id number]     Arrival date & time 07/28/13  3149 History   First MD Initiated Contact with Patient 07/28/13 1002     Chief Complaint  Patient presents with  . Abdominal Pain    2 months pregnant   . Weakness  . Dizziness     (Consider location/radiation/quality/duration/timing/severity/associated sxs/prior Treatment) Patient is a 43 y.o. female presenting with abdominal pain. The history is provided by the patient. The history is limited by a language barrier. A language interpreter was used.  Abdominal Pain Pain location:  Suprapubic and LLQ Pain quality: aching and dull   Pain radiates to:  Does not radiate Pain severity:  Moderate Onset quality:  Gradual Duration:  1 day Timing:  Constant Progression:  Unchanged Chronicity:  New Context: not alcohol use, not previous surgeries, not suspicious food intake and not trauma   Relieved by:  Nothing Worsened by:  Nothing tried Ineffective treatments:  None tried Associated symptoms: vaginal bleeding   Associated symptoms: no diarrhea, no fever, no hematuria and no vaginal discharge   Risk factors: pregnancy     Past Medical History  Diagnosis Date  . Medical history non-contributory   . Generalized headaches    Past Surgical History  Procedure Laterality Date  . No past surgeries     No family history on file. History  Substance Use Topics  . Smoking status: Never Smoker   . Smokeless tobacco: Never Used  . Alcohol Use: Yes     Comment: occassional   OB History   Grav Para Term Preterm Abortions TAB SAB Ect Mult Living   4 3 3       3      Review of Systems  Constitutional: Negative for fever.  Gastrointestinal: Positive for abdominal pain. Negative for diarrhea.  Genitourinary: Positive for vaginal bleeding. Negative for hematuria and vaginal discharge.  All other systems reviewed and are negative.     Allergies  Other  Home Medications   Prior to Admission medications   Medication Sig Start Date  End Date Taking? Authorizing Provider  ibuprofen (ADVIL,MOTRIN) 800 MG tablet Take 1 tablet (800 mg total) by mouth 3 (three) times daily. 03/11/13   Marissa Sciacca, PA-C   BP 108/74  Pulse 85  Temp(Src) 98.7 F (37.1 C) (Oral)  Resp 16  SpO2 100% Physical Exam  Constitutional: She is oriented to person, place, and time. She appears well-developed and well-nourished. No distress.  HENT:  Head: Normocephalic.  Eyes: Conjunctivae are normal.  Neck: Neck supple. No tracheal deviation present.  Cardiovascular: Normal rate and regular rhythm.   Pulmonary/Chest: Effort normal. No respiratory distress.  Abdominal: Soft. She exhibits no distension. There is tenderness in the suprapubic area. There is no rigidity, no rebound, no guarding, no CVA tenderness, no tenderness at McBurney's point and negative Murphy's sign.  Genitourinary: Cervix exhibits no motion tenderness, no discharge and no friability. Right adnexum displays no mass, no tenderness and no fullness. Left adnexum displays no mass, no tenderness and no fullness.  Scant dark blood at a closed os  Neurological: She is alert and oriented to person, place, and time.  Skin: Skin is warm and dry.  Psychiatric: She has a normal mood and affect.    ED Course  Procedures (including critical care time) Labs Review Labs Reviewed  URINALYSIS, ROUTINE W REFLEX MICROSCOPIC - Abnormal; Notable for the following:    APPearance TURBID (*)    Hgb urine dipstick LARGE (*)    Leukocytes, UA  LARGE (*)    All other components within normal limits  CBC WITH DIFFERENTIAL - Abnormal; Notable for the following:    MCV 76.5 (*)    All other components within normal limits  COMPREHENSIVE METABOLIC PANEL - Abnormal; Notable for the following:    Alkaline Phosphatase 29 (*)    GFR calc non Af Amer 85 (*)    All other components within normal limits  HCG, QUANTITATIVE, PREGNANCY - Abnormal; Notable for the following:    hCG, Beta Chain, Quant, S 1975  (*)    All other components within normal limits  URINE MICROSCOPIC-ADD ON - Abnormal; Notable for the following:    Squamous Epithelial / LPF MANY (*)    Bacteria, UA MANY (*)    All other components within normal limits  POC URINE PREG, ED - Abnormal; Notable for the following:    Preg Test, Ur POSITIVE (*)    All other components within normal limits  URINE CULTURE  GC/CHLAMYDIA PROBE AMP  RPR  I-STAT CG4 LACTIC ACID, ED  TYPE AND SCREEN    Imaging Review US Ob Comp Less 14 Wks  07/28/2013   CLINICAL DATA:  Miscarriage January 2015.  Positive beta HCG.  EXAM: OBSTETRIC <14 WK ULTRASOUND  TECHNIQUE: Transabdominal ultrasound was performed for evaluation of the gestation as well as the maternal uterus and adnexal regions.  COMPARISON:  None.  FINDINGS: Intrauterine gestational sac: Gestational sac is irregular.  Yolk sac:  Present.  Embryo:  None identified.  MSD: 23.2  mm   7 w   2  d  Maternal uterus/adnexae: Uterus and adnexa normal.  No free fluid.  IMPRESSION: Irregular gestational sac containing a fetal pole. Yolk sac appears to be present. Differential diagnosis includes an ovulatory pregnancy, missed abortion, and early intrauterine pregnancy. . Follow-up beta HCG and pelvic ultrasound suggested. Findings are suspicious but not yet definitive for failed pregnancy. Recommend follow-up US in 10-14 days for definitive diagnosis. This recommendation follows SRU consensus guidelines: Diagnostic Criteria for Nonviable Pregnancy Early in the First Trimester. Malva Limes Med 2013; 161:0960-45.   Electronically Signed   By: Maisie Fus  Register   On: 07/28/2013 14:21   US Ob Transvaginal  07/28/2013   CLINICAL DATA:  Miscarriage January 2015.  Positive beta HCG.  EXAM: OBSTETRIC <14 WK ULTRASOUND  TECHNIQUE: Transabdominal ultrasound was performed for evaluation of the gestation as well as the maternal uterus and adnexal regions.  COMPARISON:  None.  FINDINGS: Intrauterine gestational sac: Gestational  sac is irregular.  Yolk sac:  Present.  Embryo:  None identified.  MSD: 23.2  mm   7 w   2  d  Maternal uterus/adnexae: Uterus and adnexa normal.  No free fluid.  IMPRESSION: Irregular gestational sac containing a fetal pole. Yolk sac appears to be present. Differential diagnosis includes an ovulatory pregnancy, missed abortion, and early intrauterine pregnancy. . Follow-up beta HCG and pelvic ultrasound suggested. Findings are suspicious but not yet definitive for failed pregnancy. Recommend follow-up US in 10-14 days for definitive diagnosis. This recommendation follows SRU consensus guidelines: Diagnostic Criteria for Nonviable Pregnancy Early in the First Trimester. Malva Limes Med 2013; 409:8119-14.   Electronically Signed   By: Maisie Fus  Register   On: 07/28/2013 14:21     EKG Interpretation None      MDM   Final diagnoses:  Threatened abortion in early pregnancy  UTI (lower urinary tract infection)    44 y.o. [id number] presents with left lower abdominal pain  that started yesterday, has continued today. She admits to some vaginal bleeding, bleeding she is pregnant but has not taken the test. She has not had a period for the last 2 months. Her quantitative hCG level is suggestive of early or underdeveloped pregnancy but is above the threshold of visualization. Her urine is consistent with probable UTI and pyuria. Transvaginal ultrasound was ordered to evaluate location of her pregnancy and an irregular gestational sac containing a fetal pole was located but not consistent with viable pregnancy given her quant level. We recommended she followup in 48 hours for repeat level and she was advised that it is possible she could have a completed miscarriage between now and then. She was advised to return with any fevers, worsening abdominal pain, or other concerning symptoms. No evidence on ultrasound of extrauterine pregnancy location. She was prescribed Macrobid in the interim.  Lyndal Pulleyaniel Jennel Mara, MD 07/28/13  [phone]

## 2013-07-28 NOTE — Discharge Instructions (Signed)
Threatened Miscarriage °Bleeding during the first 20 weeks of pregnancy is common. This is sometimes called a threatened miscarriage. This is a pregnancy that is threatening to end before the twentieth week of pregnancy. Often this bleeding stops with bed rest or decreased activities as suggested by your caregiver and the pregnancy continues without any more problems. You may be asked to not have sexual intercourse, have orgasms or use tampons until further notice. Sometimes a threatened miscarriage can progress to a complete or incomplete miscarriage. This may or may not require further treatment. Some miscarriages occur before a woman misses a menstrual period and knows she is pregnant. °Miscarriages occur in 15 to 20% of all pregnancies and usually occur during the first 13 weeks of the pregnancy. The exact cause of a miscarriage is usually never known. A miscarriage is natures way of ending a pregnancy that is abnormal or would not make it to term. There are some things that may put you at risk to have a miscarriage, such as: °· Hormone problems. °· Infection of the uterus or cervix. °· Chronic illness, diabetes for example, especially if it is not controlled. °· Abnormal shaped uterus. °· Fibroids in the uterus. °· Incompetent cervix (the cervix is too weak to hold the baby). °· Smoking. °· Drinking too much alcohol. It's best not to drink any alcohol when you are pregnant. °· Taking illegal drugs. °TREATMENT  °When a miscarriage becomes complete and all products of conception (all the tissue in the uterus) have been passed, often no treatment is needed. If you think you passed tissue, save it in a container and take it to your doctor for evaluation. If the miscarriage is incomplete (parts of the fetus or placenta remain in the uterus), further treatment may be needed. The most common reason for further treatment is continued bleeding (hemorrhage) because pregnancy tissue did not pass out of the uterus. This  often occurs if a miscarriage is incomplete. Tissue left behind may also become infected. Treatment usually is dilatation and curettage (the removal of the remaining products of pregnancy. This can be done by a simple sucking procedure (suction curettage) or a simple scraping of the inside of the uterus. This may be done in the hospital or in the caregiver's office. This is only done when your caregiver knows that there is no chance for the pregnancy to proceed to term. This is determined by physical examination, negative pregnancy test, falling pregnancy hormone count and/or, an ultrasound revealing a dead fetus. °Miscarriages are often a very emotional time for prospective mothers and fathers. This is not you or your partners fault. It did not occur because of an inadequacy in you or your partner. Nearly all miscarriages occur because the pregnancy has started off wrongly. At least half of these pregnancies have a chromosomal abnormality. It is almost always not inherited. Others may have developmental problems with the fetus or placenta. This does not always show up even when the products miscarried are studied under the microscope. The miscarriage is nearly always not your fault and it is not likely that you could have prevented it from happening. If you are having emotional and grieving problems, talk to your health care provider and even seek counseling, if necessary, before getting pregnant again. You can begin trying for another pregnancy as soon as your caregiver says it is OK. °HOME CARE INSTRUCTIONS  °· Your caregiver may order bed rest depending on how much bleeding and cramping you are having. You may be limited   to only getting up to go to the bathroom. You may be allowed to continue light activity. You may need to make arrangements for the care of your other children and for any other responsibilities.  Keep track of the number of pads you use each day, how often you have to change pads and how  saturated (soaked) they are. Record this information.  DO NOT USE TAMPONS. Do not douche, have sexual intercourse or orgasms until approved by your caregiver.  You may receive a follow up appointment for re-evaluation of your pregnancy and a repeat blood test. Re-evaluation often occurs after 2 days and again in 4 to 6 weeks. It is very important that you follow-up in the recommended time period.  If you are Rh negative and the father is Rh positive or you do not know the fathers' blood type, you may receive a shot (Rh immune globulin) to help prevent abnormal antibodies that can develop and affect the baby in any future pregnancies. SEEK IMMEDIATE MEDICAL CARE IF:  You have severe cramps in your stomach, back, or abdomen.  You have a sudden onset of severe pain in the lower part of your abdomen.  You develop chills.  You run an unexplained temperature of 101 F (38.3 C) or higher.  You pass large clots or tissue. Save any tissue for your caregiver to inspect.  Your bleeding increases or you become light-headed, weak, or have fainting episodes.  You have a gush of fluid from your vagina.  You pass out. This could mean you have a tubal (ectopic) pregnancy. Document Released: 02/13/2005 Document Revised: 05/08/2011 Document Reviewed: 09/30/2007 [facility] Patient Information 2014 [place], [place]. Urinary Tract Infection Urinary tract infections (UTIs) can develop anywhere along your urinary tract. Your urinary tract is your body's drainage system for removing wastes and extra water. Your urinary tract includes two kidneys, two ureters, a bladder, and a urethra. Your kidneys are a pair of bean-shaped organs. Each kidney is about the size of your fist. They are located below your ribs, one on each side of your spine. CAUSES Infections are caused by microbes, which are microscopic organisms, including fungi, viruses, and bacteria. These organisms are so small that they can only be seen through  a microscope. Bacteria are the microbes that most commonly cause UTIs. SYMPTOMS  Symptoms of UTIs may vary by age and gender of the patient and by the location of the infection. Symptoms in young women typically include a frequent and intense urge to urinate and a painful, burning feeling in the bladder or urethra during urination. Older women and men are more likely to be tired, shaky, and weak and have muscle aches and abdominal pain. A fever may mean the infection is in your kidneys. Other symptoms of a kidney infection include pain in your back or sides below the ribs, nausea, and vomiting. DIAGNOSIS To diagnose a UTI, your caregiver will ask you about your symptoms. Your caregiver also will ask to provide a urine sample. The urine sample will be tested for bacteria and white blood cells. White blood cells are made by your body to help fight infection. TREATMENT  Typically, UTIs can be treated with medication. Because most UTIs are caused by a bacterial infection, they usually can be treated with the use of antibiotics. The choice of antibiotic and length of treatment depend on your symptoms and the type of bacteria causing your infection. HOME CARE INSTRUCTIONS  If you were prescribed antibiotics, take them exactly as your caregiver  instructs you. Finish the medication even if you feel better after you have only taken some of the medication.  Drink enough water and fluids to keep your urine clear or pale yellow.  Avoid caffeine, tea, and carbonated beverages. They tend to irritate your bladder.  Empty your bladder often. Avoid holding urine for long periods of time.  Empty your bladder before and after sexual intercourse.  After a bowel movement, women should cleanse from front to back. Use each tissue only once. SEEK MEDICAL CARE IF:   You have back pain.  You develop a fever.  Your symptoms do not begin to resolve within 3 days. SEEK IMMEDIATE MEDICAL CARE IF:   You have severe  back pain or lower abdominal pain.  You develop chills.  You have nausea or vomiting.  You have continued burning or discomfort with urination. MAKE SURE YOU:   Understand these instructions.  Will watch your condition.  Will get help right away if you are not doing well or get worse. Document Released: 11/23/2004 Document Revised: 08/15/2011 Document Reviewed: 03/24/2011 [facility] Patient Information 2014 [place].

## 2013-07-28 NOTE — ED Notes (Addendum)
Pt states she is [redacted] weeks pregnant.  Last night she began experiencing lower abdominal cramping, light bleeding, dizziness and weakness.  Translator phone used - pt speaks only [place] from [place].

## 2013-07-28 NOTE — ED Notes (Addendum)
Interpreter service used to help pt with her language [name] interpretation, pt states she is been having sharp pain on her left lower abd, she is been seen some dark brownish secretion on her under ware, pt states she thinks she is pregnant and she may been having a miscarriage. Pt denies any nausea, vomiting on diarrhea at this time.

## 2013-07-28 NOTE — ED Notes (Signed)
NOTIFIED YASHEMIA ,RN OF PATIENTS LAB RESULTS OF CG4+ LACTIC ACID ,@12 ///////////////:30 PM ,07/28/2013.

## 2013-07-29 LAB — URINE CULTURE: Colony Count: >=100000

## 2013-07-29 LAB — GC/CHLAMYDIA PROBE AMP
CT PROBE, AMP APTIMA: NEGATIVE
GC PROBE AMP APTIMA: NEGATIVE

## 2013-07-30 ENCOUNTER — Inpatient Hospital Stay (HOSPITAL_COMMUNITY)
Admission: AD | Admit: 2013-07-30 | Discharge: 2013-07-30 | Disposition: A | Discharge status: Home / Self Care | Payer: Medicaid Other | Source: Ambulatory Visit | Attending: Family Medicine | Admitting: Family Medicine

## 2013-07-30 DIAGNOSIS — R1032 Left lower quadrant pain: Secondary | ICD-10-CM | POA: Insufficient documentation

## 2013-07-30 DIAGNOSIS — O2390 Unspecified genitourinary tract infection in pregnancy, unspecified trimester: Secondary | ICD-10-CM | POA: Insufficient documentation

## 2013-07-30 DIAGNOSIS — N390 Urinary tract infection, site not specified: Secondary | ICD-10-CM | POA: Insufficient documentation

## 2013-07-30 DIAGNOSIS — O209 Hemorrhage in early pregnancy, unspecified: Secondary | ICD-10-CM

## 2013-07-30 NOTE — MAU Note (Signed)
Per PA, pt did not need to come for follow up blood work, pt will be scheduled for [place] in 7-10 days

## 2013-07-30 NOTE — MAU Provider Note (Signed)
[name] is a 43 y.o. [phone] at Unknown who presents to MAU today for follow-up. Patient was seen at [facility] on 07/28/13 for LLQ pain. She had +UPT and US showed irregular IUGS with YS at [redacted]w[redacted]d without embryo or cardiac activity. She states pain is mild and rates at 2/10 today in the LLQ. She is having spotting and not requiring a pad today. She denies fever. She states occasional N/V with one episode yesterday.   BP 102/80  Pulse 101  Temp(Src) 98.2 F (36.8 C) (Oral)  Resp 18  Wt 124 lb 12.8 oz (56.609 kg)  SpO2 99% GENERAL: Well-developed, well-nourished female in no acute distress.  HEENT: Normocephalic, atraumatic.   LUNGS: Normal effort HEART: Regular rate  ABDOMEN: Soft, nontender, nondistended. No organomegaly. No CVA tenderness EXTREMITIES: No cyanosis, clubbing, or edema  MDM Discussed with Dr. Shawnie Pons. Follow-up US in 7-10 days to confirm viability since already have confirmed IUP  A: IUGS and YS at [redacted]w[redacted]d Vaginal bleeding in pregnancy prior to [redacted] weeks gestation Abdominal pain in pregnancy UTI, currently on Macrobid  P: Discharge home Bleeding precautions discussed Tylenol recommended PRN pain Warning signs for Pyelonephritis discussed Patient scheduled for follow-up US on 08/06/13 @ 8:45 am Patient may return to MAU as needed or if her condition were to change or worsen  Freddi Starr, PA-C 07/30/2013 11:22 AM

## 2013-07-30 NOTE — MAU Provider Note (Signed)
Attestation of Attending Supervision of Advanced Practitioner (PA/CNM/NP): Evaluation and management procedures were performed by the Advanced Practitioner under my supervision and collaboration.  I have reviewed the Advanced Practitioner's note and chart, and I agree with the management and plan.  Reva Bores, MD [facility] Attending 07/30/2013 12:55 PM

## 2013-07-30 NOTE — Discharge Instructions (Signed)
Pelvic Rest Pelvic rest is sometimes recommended for women when:   The placenta is partially or completely covering the opening of the cervix (placenta previa).  There is bleeding between the uterine wall and the amniotic sac in the first trimester (subchorionic hemorrhage).  The cervix begins to open without labor starting (incompetent cervix, cervical insufficiency).  The labor is too early (preterm labor). HOME CARE INSTRUCTIONS  Do not have sexual intercourse, stimulation, or an orgasm.  Do not use tampons, douche, or put anything in the vagina.  Do not lift anything over 10 pounds (4.5 kg).  Avoid strenuous activity or straining your pelvic muscles. SEEK MEDICAL CARE IF:  You have any vaginal bleeding during pregnancy. Treat this as a potential emergency.  You have cramping pain felt low in the stomach (stronger than menstrual cramps).  You notice vaginal discharge (watery, mucus, or bloody).  You have a low, dull backache.  There are regular contractions or uterine tightening. SEEK IMMEDIATE MEDICAL CARE IF: You have vaginal bleeding and have placenta previa.  Document Released: 06/10/2010 Document Revised: 05/08/2011 Document Reviewed: 06/10/2010 [facility] Patient Information 2014 [place], [place].  Threatened Miscarriage Bleeding during the first 20 weeks of pregnancy is common. This is sometimes called a threatened miscarriage. This is a pregnancy that is threatening to end before the twentieth week of pregnancy. Often this bleeding stops with bed rest or decreased activities as suggested by your caregiver and the pregnancy continues without any more problems. You may be asked to not have sexual intercourse, have orgasms or use tampons until further notice. Sometimes a threatened miscarriage can progress to a complete or incomplete miscarriage. This may or may not require further treatment. Some miscarriages occur before a woman misses a menstrual period and knows she is  pregnant. Miscarriages occur in 15 to 20% of all pregnancies and usually occur during the first 13 weeks of the pregnancy. The exact cause of a miscarriage is usually never known. A miscarriage is natures way of ending a pregnancy that is abnormal or would not make it to term. There are some things that may put you at risk to have a miscarriage, such as:  Hormone problems.  Infection of the uterus or cervix.  Chronic illness, diabetes for example, especially if it is not controlled.  Abnormal shaped uterus.  Fibroids in the uterus.  Incompetent cervix (the cervix is too weak to hold the baby).  Smoking.  Drinking too much alcohol. It's best not to drink any alcohol when you are pregnant.  Taking illegal drugs. TREATMENT  When a miscarriage becomes complete and all products of conception (all the tissue in the uterus) have been passed, often no treatment is needed. If you think you passed tissue, save it in a container and take it to your doctor for evaluation. If the miscarriage is incomplete (parts of the fetus or placenta remain in the uterus), further treatment may be needed. The most common reason for further treatment is continued bleeding (hemorrhage) because pregnancy tissue did not pass out of the uterus. This often occurs if a miscarriage is incomplete. Tissue left behind may also become infected. Treatment usually is dilatation and curettage (the removal of the remaining products of pregnancy. This can be done by a simple sucking procedure (suction curettage) or a simple scraping of the inside of the uterus. This may be done in the hospital or in the caregiver's office. This is only done when your caregiver knows that there is no chance for the pregnancy to  proceed to term. This is determined by physical examination, negative pregnancy test, falling pregnancy hormone count and/or, an ultrasound revealing a dead fetus. Miscarriages are often a very emotional time for prospective  mothers and fathers. This is not you or your partners fault. It did not occur because of an inadequacy in you or your partner. Nearly all miscarriages occur because the pregnancy has started off wrongly. At least half of these pregnancies have a chromosomal abnormality. It is almost always not inherited. Others may have developmental problems with the fetus or placenta. This does not always show up even when the products miscarried are studied under the microscope. The miscarriage is nearly always not your fault and it is not likely that you could have prevented it from happening. If you are having emotional and grieving problems, talk to your health care provider and even seek counseling, if necessary, before getting pregnant again. You can begin trying for another pregnancy as soon as your caregiver says it is OK. HOME CARE INSTRUCTIONS   Your caregiver may order bed rest depending on how much bleeding and cramping you are having. You may be limited to only getting up to go to the bathroom. You may be allowed to continue light activity. You may need to make arrangements for the care of your other children and for any other responsibilities.  Keep track of the number of pads you use each day, how often you have to change pads and how saturated (soaked) they are. Record this information.  DO NOT USE TAMPONS. Do not douche, have sexual intercourse or orgasms until approved by your caregiver.  You may receive a follow up appointment for re-evaluation of your pregnancy and a repeat blood test. Re-evaluation often occurs after 2 days and again in 4 to 6 weeks. It is very important that you follow-up in the recommended time period.  If you are Rh negative and the father is Rh positive or you do not know the fathers' blood type, you may receive a shot (Rh immune globulin) to help prevent abnormal antibodies that can develop and affect the baby in any future pregnancies. SEEK IMMEDIATE MEDICAL CARE IF:  You  have severe cramps in your stomach, back, or abdomen.  You have a sudden onset of severe pain in the lower part of your abdomen.  You develop chills.  You run an unexplained temperature of 101 F (38.3 C) or higher.  You pass large clots or tissue. Save any tissue for your caregiver to inspect.  Your bleeding increases or you become light-headed, weak, or have fainting episodes.  You have a gush of fluid from your vagina.  You pass out. This could mean you have a tubal (ectopic) pregnancy. Document Released: 02/13/2005 Document Revised: 05/08/2011 Document Reviewed: 09/30/2007 [facility] Patient Information 2014 [place], [place].

## 2013-08-06 ENCOUNTER — Ambulatory Visit (HOSPITAL_COMMUNITY)
Admit: 2013-08-06 | Discharge: 2013-08-06 | Disposition: A | Discharge status: Home / Self Care | Payer: Medicaid Other | Attending: Medical | Admitting: Medical

## 2013-08-06 DIAGNOSIS — O209 Hemorrhage in early pregnancy, unspecified: Secondary | ICD-10-CM

## 2013-08-06 DIAGNOSIS — O200 Threatened abortion: Secondary | ICD-10-CM | POA: Insufficient documentation

## 2013-08-07 ENCOUNTER — Encounter: Payer: Self-pay | Admitting: Obstetrics and Gynecology

## 2013-08-07 ENCOUNTER — Ambulatory Visit (INDEPENDENT_AMBULATORY_CARE_PROVIDER_SITE_OTHER): Payer: Medicaid Other | Admitting: Obstetrics and Gynecology

## 2013-08-07 VITALS — BP 113/84 | HR 98 | Wt 121.0 lb

## 2013-08-07 DIAGNOSIS — O021 Missed abortion: Secondary | ICD-10-CM

## 2013-08-07 MED ORDER — MISOPROSTOL 200 MCG PO TABS: ORAL_TABLET | ORAL | Status: DC

## 2013-08-07 NOTE — Progress Notes (Signed)
Patient ID: [name], female   DOB: 1970-03-28, 43 y.o.   MRN: [id number] 32 yp G4P3 diagnosed with a failed pregnancy yesterday presenting today to discuss ultrasound results and management plan. Ultrasound results reviewed with the patient  6/10 ultrasound FINDINGS:  Intrauterine gestational sac: Visualized/normal in shape.  Yolk sac: Suspected  Embryo: Not visualized  MSD: 34 mm 8 w 6 d  Maternal uterus/adnexae: No subchorionic hemorrhage.  Bilateral ovaries are within normal limits.  No free fluid.  IMPRESSION:  Intrauterine gestational sac with suspected yolk sac but no fetal  pole. Mean sac diameter 34 mm.  Findings meet definitive criteria for failed pregnancy.  This follows SRU consensus guidelines: Diagnostic Criteria for  Nonviable Pregnancy Early in the First Trimester. [facility]  [id number].  Electronically Signed  By: Charline Bills M.D.  On: 08/06/2013 09:50  Patient understands that she has a failed pregnancy. Expectant, medical and surgical management options discussed. Patient opted for medical management with cytotec. RTC in 2 weeks for follow up.  This was a desired pregnancy and patient would like to conceive again.

## 2013-08-22 ENCOUNTER — Ambulatory Visit (INDEPENDENT_AMBULATORY_CARE_PROVIDER_SITE_OTHER): Payer: Medicaid Other | Admitting: Obstetrics and Gynecology

## 2013-08-22 ENCOUNTER — Encounter: Payer: Self-pay | Admitting: Obstetrics and Gynecology

## 2013-08-22 VITALS — BP 112/79 | HR 92 | Temp 97.3°F | Ht 62.0 in | Wt 119.8 lb

## 2013-08-22 DIAGNOSIS — O021 Missed abortion: Secondary | ICD-10-CM

## 2013-08-22 LAB — HCG, QUANTITATIVE, PREGNANCY: HCG, BETA CHAIN, QUANT, S: 94.8 m[IU]/mL

## 2013-08-22 MED ORDER — MISOPROSTOL 200 MCG PO TABS: ORAL_TABLET | ORAL | Status: DC

## 2013-08-22 NOTE — Progress Notes (Signed)
Patient ID: [name], female   DOB: 01/15/1971, 43 y.o.   MRN: [id number] 43 yo G3P3 with missed ab seen on 6/11 and opted for medical management with cytotec. Patient did not pick up prescription of cytotec and returns today. She denies any cramping or bleeding.  GENERAL: Well-developed, well-nourished female in no acute distress.  ABDOMEN: Soft, nontender, nondistended. No organomegaly. PELVIC: Normal external female genitalia. Vagina is pink and rugated.  Normal discharge. Normal appearing cervix. No blood in vault. Uterus is normal in size. No adnexal mass or tenderness. EXTREMITIES: No cyanosis, clubbing, or edema, 2+ distal pulses.  New prescription was provided today.  Will draw a quant HCG RTC in 2 weeks

## 2013-09-04 ENCOUNTER — Encounter: Payer: Self-pay | Admitting: Obstetrics and Gynecology

## 2013-09-04 ENCOUNTER — Ambulatory Visit (INDEPENDENT_AMBULATORY_CARE_PROVIDER_SITE_OTHER): Payer: Medicaid Other | Admitting: Obstetrics and Gynecology

## 2013-09-04 VITALS — BP 110/79 | HR 72 | Temp 97.3°F | Ht 62.0 in | Wt 120.3 lb

## 2013-09-04 DIAGNOSIS — O021 Missed abortion: Secondary | ICD-10-CM

## 2013-09-04 NOTE — Progress Notes (Signed)
Patient ID: [name], female   DOB: 10/09/1970, 43 y.o.   MRN: [id number] 43 yo [id number] diagnosed with a missed abortion s/p medical management with cytotec. Patient reports a few days of vaginal bleeding with cramping pain.  GENERAL: Well-developed, well-nourished female in no acute distress.  ABDOMEN: Soft, nontender, nondistended. No organomegaly. PELVIC: Normal external female genitalia. Vagina is pink and rugated.  Normal discharge. Normal appearing cervix. Uterus is normal in size. No adnexal mass or tenderness. EXTREMITIES: No cyanosis, clubbing, or edema, 2+ distal pulses.  A/P 43 yo with missed abortion s/p cytotec - will repeat quant HCG - Advised patient to abstain until this pregnancy is resolved. Her husband would like to have another child - patient will be contacted for repeat weekly quant HCG

## 2013-09-05 LAB — HCG, QUANTITATIVE, PREGNANCY: hCG, Beta Chain, Quant, S: 24.1 m[IU]/mL

## 2013-09-08 ENCOUNTER — Telehealth: Payer: Self-pay

## 2013-09-08 NOTE — Telephone Encounter (Signed)
Attempted to call patient with pacific interpreter id# [phone]. Called both home and mobile number-- no answer. Left message stating we are calling with results and to inform you of a scheduled lab appointment 09/11/13 (friday) at 0900, please call clinic.

## 2013-09-08 NOTE — Telephone Encounter (Signed)
Message copied by Louanna RawAMPBELL, Monna Crean M on Mon Sep 08, 2013  4:43 PM ------      Message from: CONSTANT, PEGGY      Created: Mon Sep 08, 2013  1:29 PM       Please inform patient of decreasing quant HCG. Patient needs to return for quant HCG only in 1 week ------

## 2013-09-10 NOTE — Telephone Encounter (Signed)
Called patient with pacific interpreter [phone], no answer on home number- left message that we are trying to reach you about an appt, please call us back

## 2013-09-11 ENCOUNTER — Other Ambulatory Visit: Payer: Medicaid Other

## 2013-09-11 DIAGNOSIS — N939 Abnormal uterine and vaginal bleeding, unspecified: Secondary | ICD-10-CM

## 2013-09-11 NOTE — Telephone Encounter (Signed)
Pt at clinic for appointment on 7/16.

## 2013-09-12 LAB — HCG, QUANTITATIVE, PREGNANCY: HCG, BETA CHAIN, QUANT, S: 3.8 m[IU]/mL

## 2013-09-24 ENCOUNTER — Ambulatory Visit: Payer: Medicaid Other | Admitting: Obstetrics & Gynecology

## 2013-12-29 ENCOUNTER — Encounter: Payer: Self-pay | Admitting: Obstetrics and Gynecology

## 2014-04-26 IMAGING — US US OB TRANSVAGINAL
1 series · 13 of 28 positions shown · non-contrast
Comparison: None.

CLINICAL DATA: Abdominal pain

OBSTETRIC <14 WK US AND TRANSVAGINAL OB US
TECHNIQUE: Both transabdominal and transvaginal ultrasound
examinations were performed for complete evaluation of the
gestation as well as the maternal uterus, adnexal regions, and
pelvic cul-de-sac.  Transvaginal technique was performed to assess
early pregnancy.

[Series 1: us ob transvaginal · 0.23mm/px · 13 of 64 slices shown]
[im 3/64]
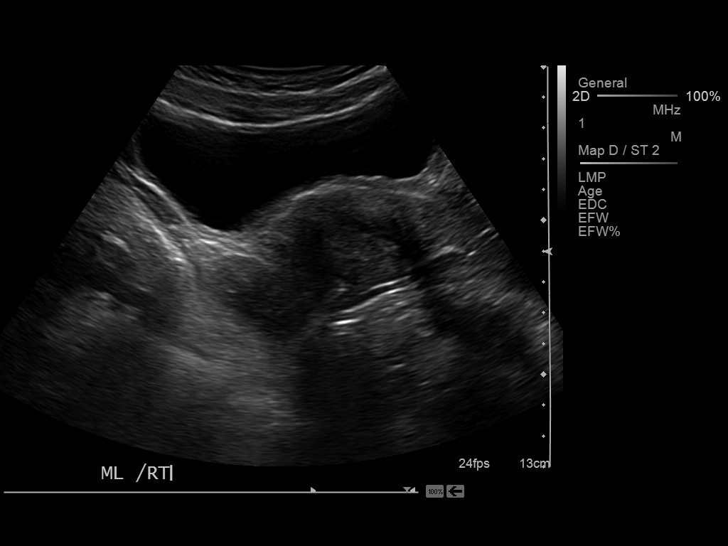
[im 8/64]
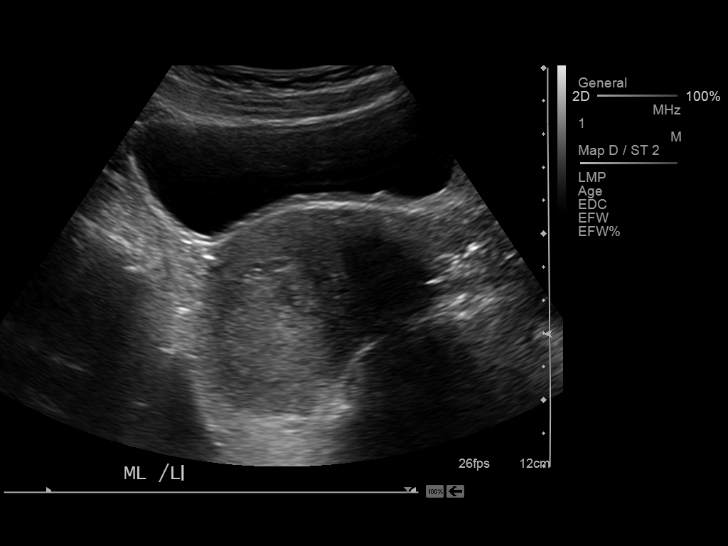
[im 12/64]
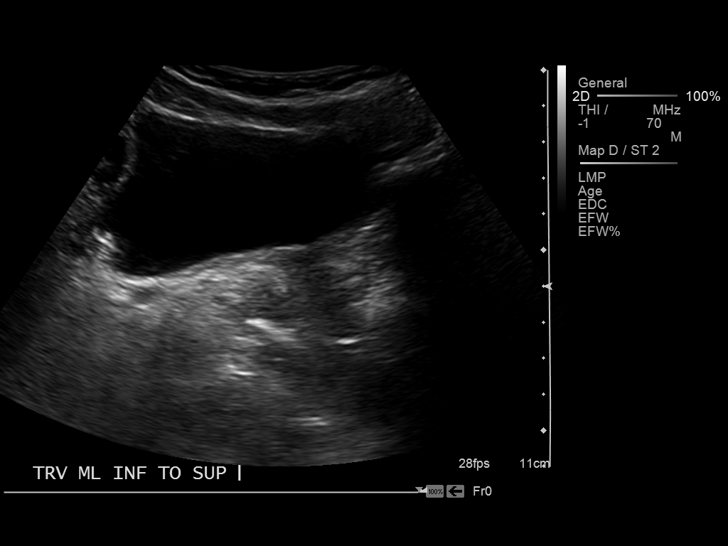
[im 17/64]
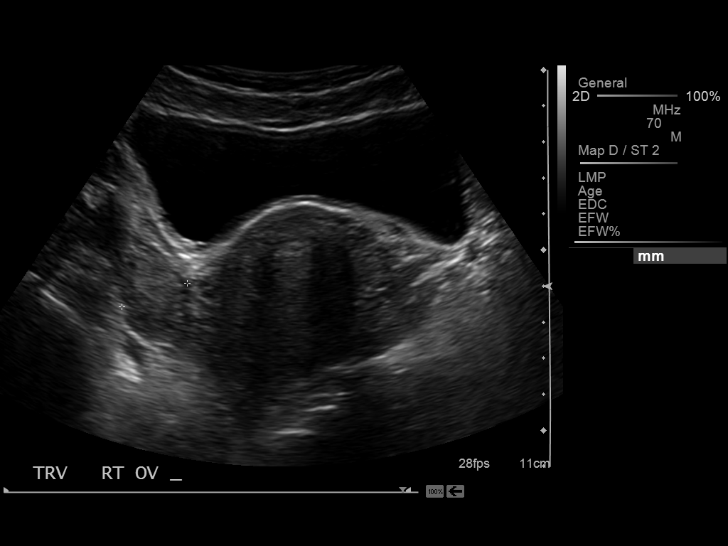
[im 22/64]
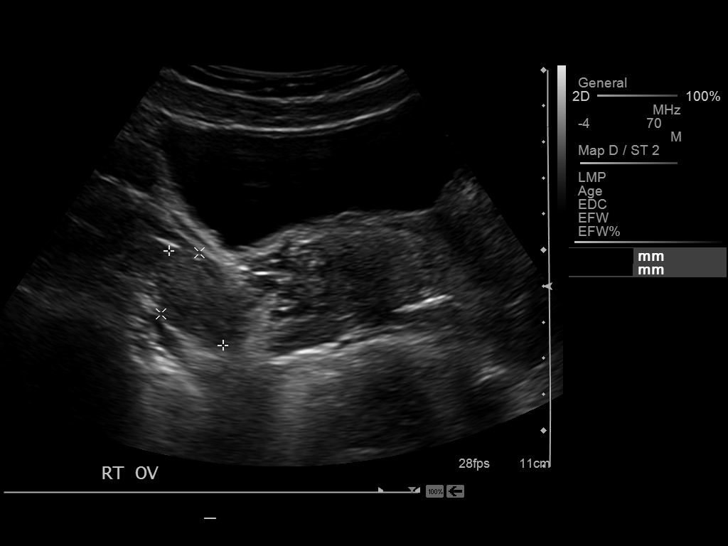
[im 26/64]
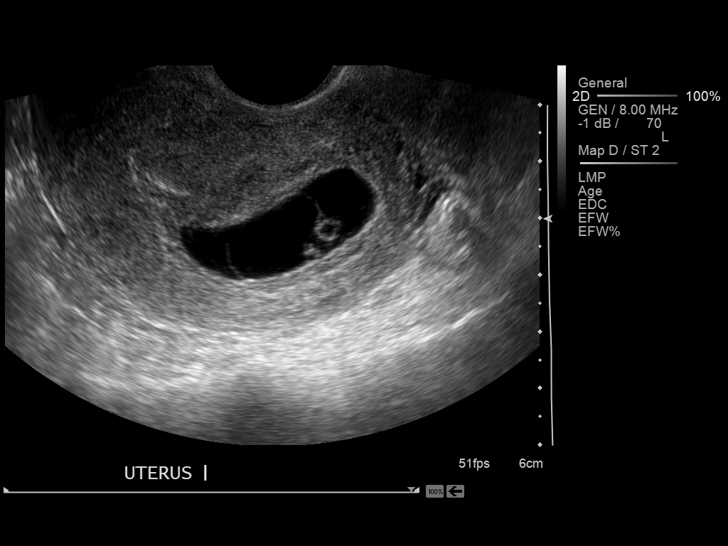
[im 33/64]
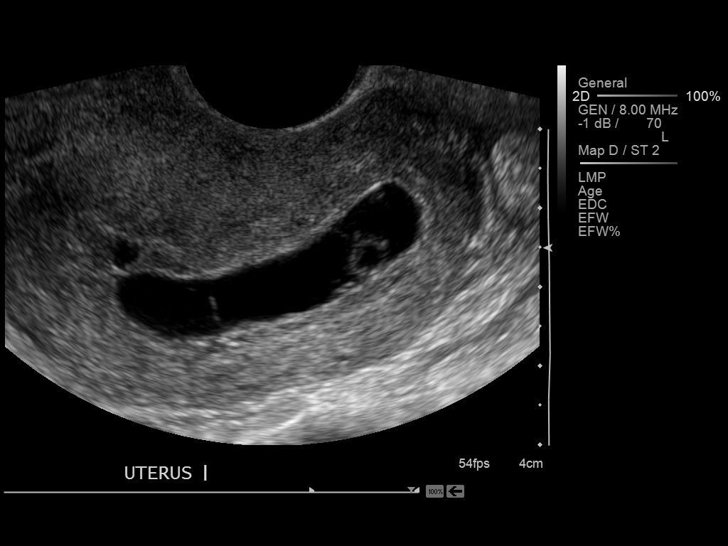
[im 38/64]
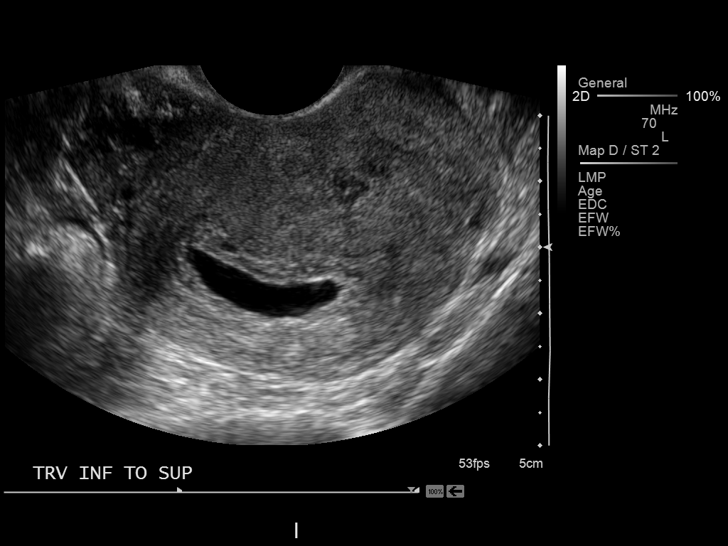
[im 43/64]
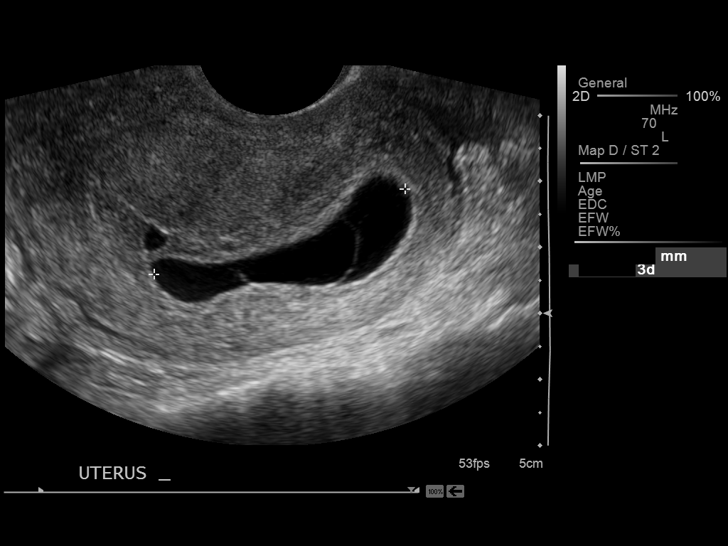
[im 47/64]
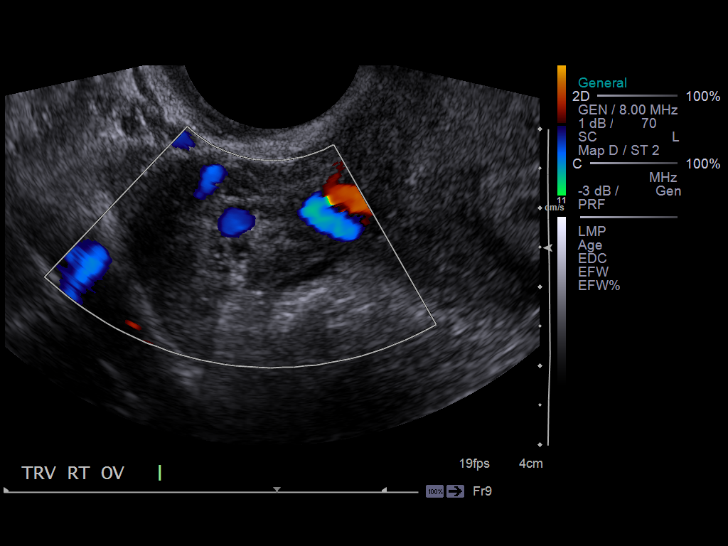
[im 52/64]
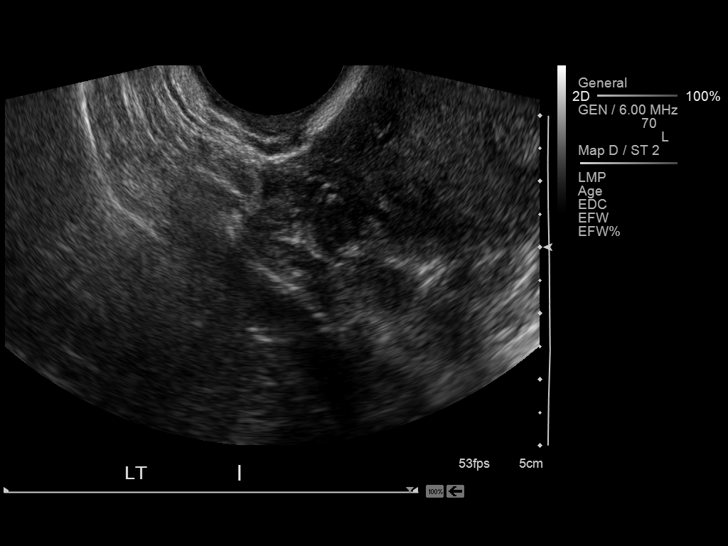
[im 57/64]
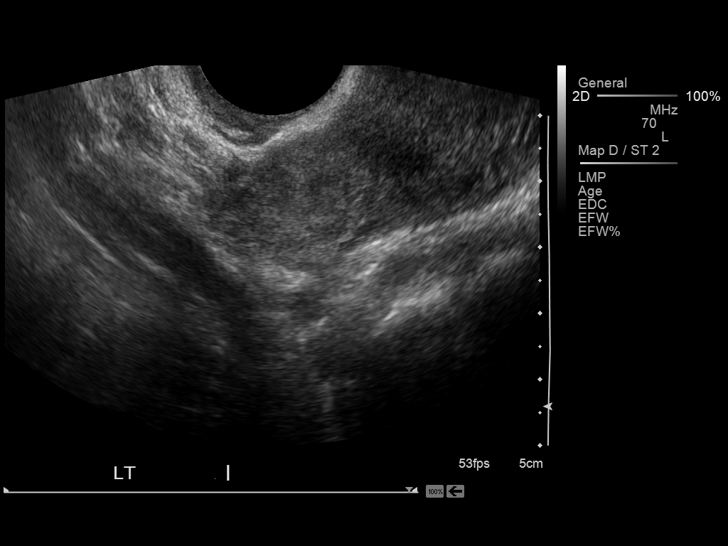
[im 61/64]
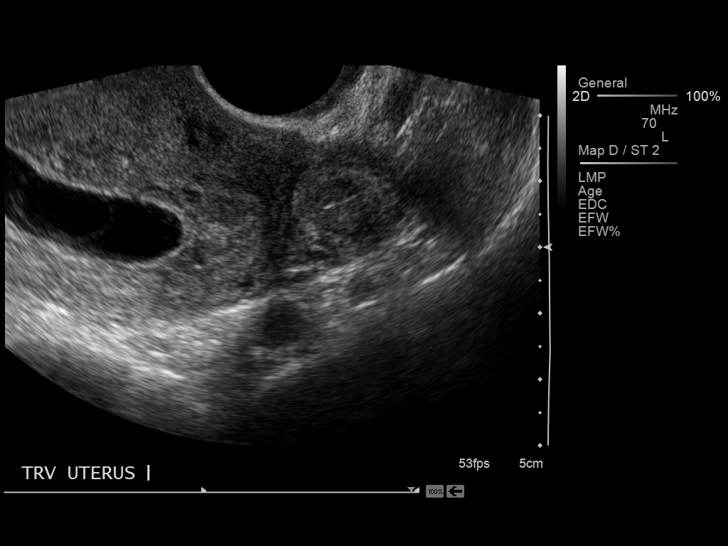

[13 of 28 positions shown; findings below may reference images not displayed]

Intrauterine gestational sac:  Present, single and grossly normal.
Yolk sac: Present and grossly normal.
Embryo: Not visible.  There is a cystic abnormality within the
collapsed sac.  I think this constellation of findings indicates a
blighted ovum, possibly with a hydropic blighted ovum.  Any case,
there is certainly not a normal and viable appearing pregnancy.
MSD: 38.6 mm  nine w two d
            US EDC: 11/21/2012

Maternal uterus/adnexae:
Small amount of subchorionic hemorrhage suspected.

Both ovaries appear normal with the right measuring 3.0 x 2.0 cm
and the left measuring 2.2 x 1.4 cm.  Both show flow.
IMPRESSION: Abnormal appearance.  Intrauterine gestational sac with a yolk sac
but no visible fetal pole.  Additional cystic structure within the
gestational sac that could be a blighted ovum.  In a gestational
sac of this size, the fetal pole should be visible.  Additionally,
the gestational sac has an abnormal flattened appearance. In
summary, this is an abnormal early intrauterine pregnancy, probably
a blighted ovum.

## 2015-02-18 ENCOUNTER — Emergency Department (HOSPITAL_COMMUNITY)
Admission: EM | Admit: 2015-02-18 | Discharge: 2015-02-18 | Disposition: A | Discharge status: Home / Self Care | Payer: Medicaid Other | Attending: Emergency Medicine | Admitting: Emergency Medicine

## 2015-02-18 ENCOUNTER — Encounter (HOSPITAL_COMMUNITY): Payer: Self-pay

## 2015-02-18 ENCOUNTER — Emergency Department (HOSPITAL_COMMUNITY): Payer: Medicaid Other

## 2015-02-18 DIAGNOSIS — R110 Nausea: Secondary | ICD-10-CM | POA: Insufficient documentation

## 2015-02-18 DIAGNOSIS — R51 Headache: Secondary | ICD-10-CM | POA: Insufficient documentation

## 2015-02-18 DIAGNOSIS — H53149 Visual discomfort, unspecified: Secondary | ICD-10-CM | POA: Diagnosis not present

## 2015-02-18 DIAGNOSIS — Z87898 Personal history of other specified conditions: Secondary | ICD-10-CM | POA: Insufficient documentation

## 2015-02-18 DIAGNOSIS — R519 Headache, unspecified: Secondary | ICD-10-CM

## 2015-02-18 LAB — BASIC METABOLIC PANEL
ANION GAP: 9 (ref 5–15)
BUN: 10 mg/dL (ref 6–20)
CO2: 26 mmol/L (ref 22–32)
Calcium: 8.9 mg/dL (ref 8.9–10.3)
Chloride: 104 mmol/L (ref 101–111)
Creatinine, Ser: 0.84 mg/dL (ref 0.44–1.00)
GFR calc Af Amer: >60 mL/min (ref >60)
GFR calc non Af Amer: >60 mL/min (ref >60)
Glucose, Bld: 94 mg/dL (ref 65–99)
POTASSIUM: 3.9 mmol/L (ref 3.5–5.1)
SODIUM: 139 mmol/L (ref 135–145)

## 2015-02-18 LAB — URINALYSIS, ROUTINE W REFLEX MICROSCOPIC
Bilirubin Urine: NEGATIVE
GLUCOSE, UA: NEGATIVE mg/dL
HGB URINE DIPSTICK: NEGATIVE
Ketones, ur: NEGATIVE mg/dL
LEUKOCYTES UA: NEGATIVE
Nitrite: NEGATIVE
PROTEIN: NEGATIVE mg/dL
Specific Gravity, Urine: 1.014 (ref 1.005–1.030)
pH: 7 (ref 5.0–8.0)

## 2015-02-18 LAB — CBC WITH DIFFERENTIAL/PLATELET
BASOS ABS: 0 10*3/uL (ref 0.0–0.1)
BASOS PCT: 0 %
EOS ABS: 0.1 10*3/uL (ref 0.0–0.7)
EOS PCT: 1 %
HCT: 41.4 % (ref 36.0–46.0)
Hemoglobin: 13.8 g/dL (ref 12.0–15.0)
Lymphocytes Relative: 33 %
Lymphs Abs: 2 10*3/uL (ref 0.7–4.0)
MCH: 25.9 pg — ABNORMAL LOW (ref 26.0–34.0)
MCHC: 33.3 g/dL (ref 30.0–36.0)
MCV: 77.8 fL — ABNORMAL LOW (ref 78.0–100.0)
MONO ABS: 0.2 10*3/uL (ref 0.1–1.0)
Monocytes Relative: 4 %
Neutro Abs: 3.7 10*3/uL (ref 1.7–7.7)
Neutrophils Relative %: 62 %
PLATELETS: 181 10*3/uL (ref 150–400)
RBC: 5.32 MIL/uL — ABNORMAL HIGH (ref 3.87–5.11)
RDW: 13.3 % (ref 11.5–15.5)
WBC: 6 10*3/uL (ref 4.0–10.5)

## 2015-02-18 MED ORDER — HYDROMORPHONE HCL 1 MG/ML IJ SOLN
1.0000 mg | Freq: Once | INTRAMUSCULAR | Status: AC
  Administered 2015-02-18: 1 mg via INTRAVENOUS
  Filled 2015-02-18: qty 1

## 2015-02-18 MED ORDER — SODIUM CHLORIDE 0.9 % IV SOLN
INTRAVENOUS | Status: DC
  Administered 2015-02-18: 15:00:00 via INTRAVENOUS

## 2015-02-18 MED ORDER — PROMETHAZINE HCL 25 MG PO TABS: 25.0000 mg | ORAL_TABLET | Freq: Four times a day (QID) | ORAL | Status: DC | PRN

## 2015-02-18 MED ORDER — ONDANSETRON HCL 4 MG/2ML IJ SOLN
4.0000 mg | Freq: Once | INTRAMUSCULAR | Status: AC
  Administered 2015-02-18: 4 mg via INTRAVENOUS
  Filled 2015-02-18: qty 2

## 2015-02-18 MED ORDER — SODIUM CHLORIDE 0.9 % IV BOLUS (SEPSIS)
500.0000 mL | Freq: Once | INTRAVENOUS | Status: AC
  Administered 2015-02-18: 500 mL via INTRAVENOUS

## 2015-02-18 MED ORDER — HYDROMORPHONE HCL 1 MG/ML IJ SOLN: 0.5000 mg | Freq: Once | INTRAMUSCULAR | Status: DC

## 2015-02-18 NOTE — ED Provider Notes (Signed)
CSN: [id number]     Arrival date & time 02/18/15  1241 History   First MD Initiated Contact with Patient 02/18/15 1322     Chief Complaint  Patient presents with  . Headache     (Consider location/radiation/quality/duration/timing/severity/associated sxs/prior Treatment) Patient is a 44 y.o. female presenting with headaches. The history is provided by the patient and a relative.  Headache Associated symptoms: nausea and photophobia   Associated symptoms: no abdominal pain, no back pain, no congestion, no fever, no neck pain, no sinus pressure and no vomiting    patient with complaint of generalized headache all over for one week started with some light sensitivity yesterday. Associated with nausea for a week. Patient has a history of frequent headaches but not defined as migraines. Patient denies any weakness numbness associated with nausea as mentioned but no vomiting. No fevers. No neck pain or back pain.  Past Medical History  Diagnosis Date  . Medical history non-contributory   . Generalized headaches    Past Surgical History  Procedure Laterality Date  . No past surgeries     No family history on file. Social History  Substance Use Topics  . Smoking status: Never Smoker   . Smokeless tobacco: Current User    Types: Chew  . Alcohol Use: Yes     Comment: occassional   OB History    Gravida Para Term Preterm AB TAB SAB Ectopic Multiple Living   4 3 3       3      Review of Systems  Constitutional: Negative for fever.  HENT: Negative for congestion and sinus pressure.   Eyes: Positive for photophobia.  Cardiovascular: Negative for chest pain.  Gastrointestinal: Positive for nausea. Negative for vomiting and abdominal pain.  Musculoskeletal: Negative for back pain and neck pain.  Skin: Negative for rash.  Neurological: Positive for headaches.      Allergies  Review of patient's allergies indicates no known allergies.  Home Medications   Prior to Admission  medications   Medication Sig Start Date End Date Taking? Authorizing Provider  promethazine (PHENERGAN) 25 MG tablet Take 1 tablet (25 mg total) by mouth every 6 (six) hours as needed for nausea or vomiting. 02/18/15   Vanetta MuldersScott Khiree Bukhari, MD   BP 100/75 mmHg  Pulse 70  Temp(Src) 98.5 F (36.9 C) (Oral)  Resp 16  SpO2 100% Physical Exam  Constitutional: She is oriented to person, place, and time. She appears well-developed and well-nourished. No distress.  HENT:  Head: Normocephalic and atraumatic.  Mouth/Throat: Oropharynx is clear and moist.  Eyes: Conjunctivae and EOM are normal. Pupils are equal, round, and reactive to light.  Neck: Normal range of motion. Neck supple.  Cardiovascular: Normal rate, regular rhythm and normal heart sounds.   No murmur heard. Pulmonary/Chest: Effort normal and breath sounds normal. No respiratory distress.  Abdominal: Soft. Bowel sounds are normal. There is no tenderness.  Musculoskeletal: Normal range of motion. She exhibits no edema.  Neurological: She is alert and oriented to person, place, and time. No cranial nerve deficit. She exhibits normal muscle tone. Coordination normal.  Skin: Skin is warm. No rash noted.  Nursing note and vitals reviewed.   ED Course  Procedures (including critical care time) Labs Review Labs Reviewed  CBC WITH DIFFERENTIAL/PLATELET - Abnormal; Notable for the following:    RBC 5.32 (*)    MCV 77.8 (*)    MCH 25.9 (*)    All other components within normal limits  URINALYSIS, ROUTINE  W REFLEX MICROSCOPIC (NOT AT [facility]) - Abnormal; Notable for the following:    APPearance HAZY (*)    All other components within normal limits  BASIC METABOLIC PANEL  POC URINE PREG, ED   Results for orders placed or performed during the hospital encounter of 02/18/15  CBC with Differential/Platelet  Result Value Ref Range   WBC 6.0 4.0 - 10.5 K/uL   RBC 5.32 (H) 3.87 - 5.11 MIL/uL   Hemoglobin 13.8 12.0 - 15.0 g/dL   HCT 08.6 57.8  - 46.9 %   MCV 77.8 (L) 78.0 - 100.0 fL   MCH 25.9 (L) 26.0 - 34.0 pg   MCHC 33.3 30.0 - 36.0 g/dL   RDW 62.9 52.8 - 41.3 %   Platelets 181 150 - 400 K/uL   Neutrophils Relative % 62 %   Neutro Abs 3.7 1.7 - 7.7 K/uL   Lymphocytes Relative 33 %   Lymphs Abs 2.0 0.7 - 4.0 K/uL   Monocytes Relative 4 %   Monocytes Absolute 0.2 0.1 - 1.0 K/uL   Eosinophils Relative 1 %   Eosinophils Absolute 0.1 0.0 - 0.7 K/uL   Basophils Relative 0 %   Basophils Absolute 0.0 0.0 - 0.1 K/uL  Basic metabolic panel  Result Value Ref Range   Sodium 139 135 - 145 mmol/L   Potassium 3.9 3.5 - 5.1 mmol/L   Chloride 104 101 - 111 mmol/L   CO2 26 22 - 32 mmol/L   Glucose, Bld 94 65 - 99 mg/dL   BUN 10 6 - 20 mg/dL   Creatinine, Ser 2.44 0.44 - 1.00 mg/dL   Calcium 8.9 8.9 - 01.0 mg/dL   GFR calc non Af Amer >60 >60 mL/min   GFR calc Af Amer >60 >60 mL/min   Anion gap 9 5 - 15  Urinalysis, Routine w reflex microscopic (not at [facility])  Result Value Ref Range   Color, Urine YELLOW YELLOW   APPearance HAZY (A) CLEAR   Specific Gravity, Urine 1.014 1.005 - 1.030   pH 7.0 5.0 - 8.0   Glucose, UA NEGATIVE NEGATIVE mg/dL   Hgb urine dipstick NEGATIVE NEGATIVE   Bilirubin Urine NEGATIVE NEGATIVE   Ketones, ur NEGATIVE NEGATIVE mg/dL   Protein, ur NEGATIVE NEGATIVE mg/dL   Nitrite NEGATIVE NEGATIVE   Leukocytes, UA NEGATIVE NEGATIVE     Imaging Review Ct Head Wo Contrast  02/18/2015  CLINICAL DATA:  Headache for 1 week EXAM: CT HEAD WITHOUT CONTRAST TECHNIQUE: Contiguous axial images were obtained from the base of the skull through the vertex without intravenous contrast. COMPARISON:  03/11/2013 FINDINGS: No skull fracture is noted. Paranasal sinuses and mastoid air cells are unremarkable. No intracranial hemorrhage, mass effect or midline shift. No acute cortical infarction. No mass lesion is noted on this unenhanced scan. The gray and white-matter differentiation is preserved. Ventricular size is stable  from prior exam. IMPRESSION: No acute intracranial abnormality. Electronically Signed   By: Natasha Mead M.D.   On: 02/18/2015 15:34   I have personally reviewed and evaluated these images and lab results as part of my medical decision-making.   EKG Interpretation None      MDM   Final diagnoses:  Nonintractable headache, unspecified chronicity pattern, unspecified headache type    Patient with a history of headaches. Family states that this headache is similar in present for a week. Associated with some light sensitivities. Some nausea but no vomiting. No focal findings. Head CT negative patient improved with pain medicine.  No distinct history of migraines however does have frequent headaches. Labs without significant abnormality. Patient will be discharged home with Phenergan and will return for any new or worse symptoms. Patient nontoxic no acute distress.    Vanetta Mulders, MD 02/18/15 [phone]

## 2015-02-18 NOTE — ED Notes (Addendum)
Patient here with generalized headache x 1 week, yesterday had blurred vision while working but reports that has resolved, no neuro deficits. Grips equal and no facial droop, nausea x 1 week

## 2015-02-18 NOTE — ED Notes (Signed)
Pt verbalized understanding of d/c instructions, prescriptions, and follow-up care. No further questions/concerns, VSS, assisted to lobby in wheelchair.  

## 2015-02-18 NOTE — Discharge Instructions (Signed)
Take the Phenergan as needed for the headache and for nausea. Return for any new or worse symptoms. Today's workup without any significant findings.

## 2015-04-14 ENCOUNTER — Encounter: Payer: Self-pay | Admitting: Internal Medicine

## 2015-04-14 ENCOUNTER — Ambulatory Visit (INDEPENDENT_AMBULATORY_CARE_PROVIDER_SITE_OTHER): Payer: Medicaid Other | Admitting: Internal Medicine

## 2015-04-14 VITALS — BP 114/80 | HR 68 | Resp 18 | Ht 62.0 in | Wt 118.0 lb

## 2015-04-14 DIAGNOSIS — G5601 Carpal tunnel syndrome, right upper limb: Secondary | ICD-10-CM

## 2015-04-14 DIAGNOSIS — R519 Headache, unspecified: Secondary | ICD-10-CM

## 2015-04-14 DIAGNOSIS — R51 Headache: Secondary | ICD-10-CM

## 2015-04-14 DIAGNOSIS — T7411XA Adult physical abuse, confirmed, initial encounter: Secondary | ICD-10-CM

## 2015-04-14 MED ORDER — IBUPROFEN 600 MG PO TABS: ORAL_TABLET | ORAL | Status: DC
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
## 2015-04-21 ENCOUNTER — Ambulatory Visit (INDEPENDENT_AMBULATORY_CARE_PROVIDER_SITE_OTHER): Payer: Self-pay | Admitting: Licensed Clinical Social Worker

## 2015-04-21 DIAGNOSIS — F439 Reaction to severe stress, unspecified: Secondary | ICD-10-CM
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
## 2015-05-07 ENCOUNTER — Ambulatory Visit (INDEPENDENT_AMBULATORY_CARE_PROVIDER_SITE_OTHER): Payer: Self-pay | Admitting: Licensed Clinical Social Worker

## 2015-05-07 DIAGNOSIS — F439 Reaction to severe stress, unspecified: Secondary | ICD-10-CM
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
## 2015-05-12 ENCOUNTER — Ambulatory Visit (INDEPENDENT_AMBULATORY_CARE_PROVIDER_SITE_OTHER): Payer: Medicaid Other | Admitting: Internal Medicine

## 2015-05-12 ENCOUNTER — Encounter: Payer: Self-pay | Admitting: Internal Medicine

## 2015-05-12 VITALS — BP 124/80 | HR 74 | Ht 62.0 in | Wt 116.0 lb

## 2015-05-12 DIAGNOSIS — T7491XA Unspecified adult maltreatment, confirmed, initial encounter: Secondary | ICD-10-CM

## 2015-05-12 DIAGNOSIS — T7491XD Unspecified adult maltreatment, confirmed, subsequent encounter: Secondary | ICD-10-CM

## 2015-05-12 DIAGNOSIS — R350 Frequency of micturition: Secondary | ICD-10-CM | POA: Diagnosis not present

## 2015-05-12 DIAGNOSIS — R51 Headache: Secondary | ICD-10-CM

## 2015-05-12 DIAGNOSIS — G4700 Insomnia, unspecified: Secondary | ICD-10-CM | POA: Diagnosis not present

## 2015-05-12 DIAGNOSIS — R519 Headache, unspecified: Secondary | ICD-10-CM

## 2015-05-12 DIAGNOSIS — G44209 Tension-type headache, unspecified, not intractable: Secondary | ICD-10-CM

## 2015-05-12 DIAGNOSIS — G5601 Carpal tunnel syndrome, right upper limb: Secondary | ICD-10-CM | POA: Insufficient documentation

## 2015-05-12 HISTORY — DX: Headache, unspecified: R51.9

## 2015-05-12 HISTORY — DX: Unspecified adult maltreatment, confirmed, initial encounter: T74.91XA

## 2015-05-12 HISTORY — DX: Insomnia, unspecified: G47.00

## 2015-05-12 HISTORY — DX: Carpal tunnel syndrome, right upper limb: G56.01

## 2015-05-12 LAB — POCT URINALYSIS DIPSTICK
BILIRUBIN UA: NEGATIVE
Glucose, UA: NEGATIVE
Ketones, UA: NEGATIVE
LEUKOCYTES UA: NEGATIVE
NITRITE UA: NEGATIVE
PH UA: 6
PROTEIN UA: NEGATIVE
RBC UA: NEGATIVE
Spec Grav, UA: 1.025
Urobilinogen, UA: 0.2
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
## 2015-05-20 ENCOUNTER — Ambulatory Visit (INDEPENDENT_AMBULATORY_CARE_PROVIDER_SITE_OTHER): Payer: Self-pay | Admitting: Licensed Clinical Social Worker

## 2015-05-20 DIAGNOSIS — F439 Reaction to severe stress, unspecified: Secondary | ICD-10-CM
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
## 2015-06-02 ENCOUNTER — Other Ambulatory Visit: Payer: Medicaid Other | Admitting: Licensed Clinical Social Worker

## 2015-06-03 ENCOUNTER — Other Ambulatory Visit: Payer: Medicaid Other | Admitting: Licensed Clinical Social Worker

## 2015-06-10 ENCOUNTER — Ambulatory Visit (INDEPENDENT_AMBULATORY_CARE_PROVIDER_SITE_OTHER): Payer: Self-pay | Admitting: Licensed Clinical Social Worker

## 2015-06-10 DIAGNOSIS — T7491XD Unspecified adult maltreatment, confirmed, subsequent encounter: Secondary | ICD-10-CM
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
## 2015-06-24 ENCOUNTER — Ambulatory Visit (INDEPENDENT_AMBULATORY_CARE_PROVIDER_SITE_OTHER): Payer: Self-pay | Admitting: Licensed Clinical Social Worker

## 2015-06-24 DIAGNOSIS — T7491XD Unspecified adult maltreatment, confirmed, subsequent encounter: Secondary | ICD-10-CM
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
## 2015-06-30 ENCOUNTER — Ambulatory Visit: Payer: Medicaid Other | Admitting: Internal Medicine

## 2015-07-05 ENCOUNTER — Other Ambulatory Visit: Payer: Medicaid Other | Admitting: Licensed Clinical Social Worker

## 2015-07-23 ENCOUNTER — Ambulatory Visit (INDEPENDENT_AMBULATORY_CARE_PROVIDER_SITE_OTHER): Payer: Self-pay | Admitting: Licensed Clinical Social Worker

## 2015-07-23 DIAGNOSIS — T7491XD Unspecified adult maltreatment, confirmed, subsequent encounter: Secondary | ICD-10-CM
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
## 2015-08-03 IMAGING — US US OB TRANSVAGINAL
1 series · 14 of 28 positions shown · non-contrast
Comparison: None.

CLINICAL DATA: Miscarriage February 2013.  Positive beta HCG.

EXAM:
OBSTETRIC <14 WK ULTRASOUND
TECHNIQUE: Transabdominal ultrasound was performed for evaluation of the
gestation as well as the maternal uterus and adnexal regions.

[Series 1: us ob transvaginal · 0.14mm/px · 33 acquisitions, 14 frames shown]
[im 2/33]
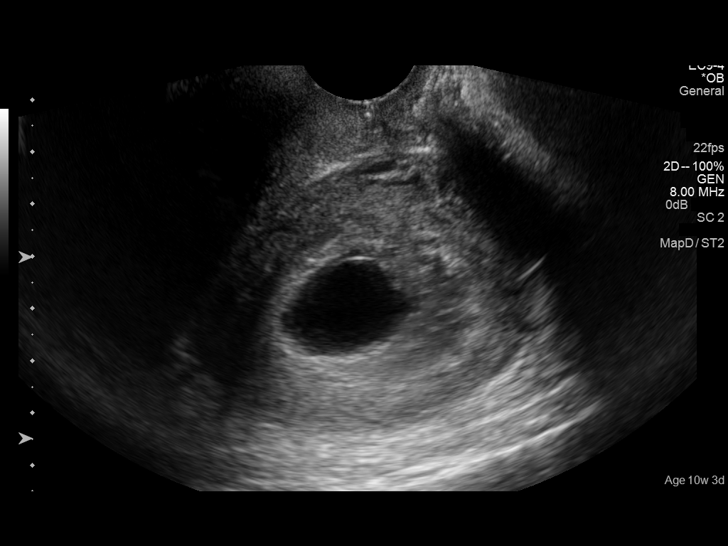
[im 4/33]
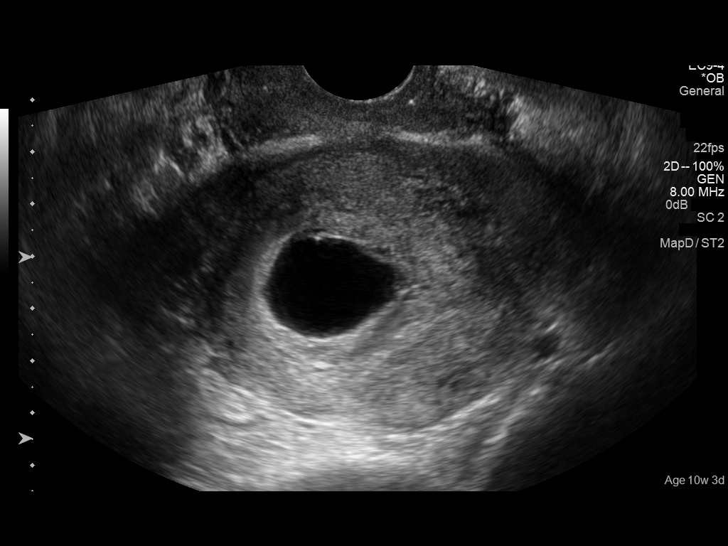
[im 6/33]
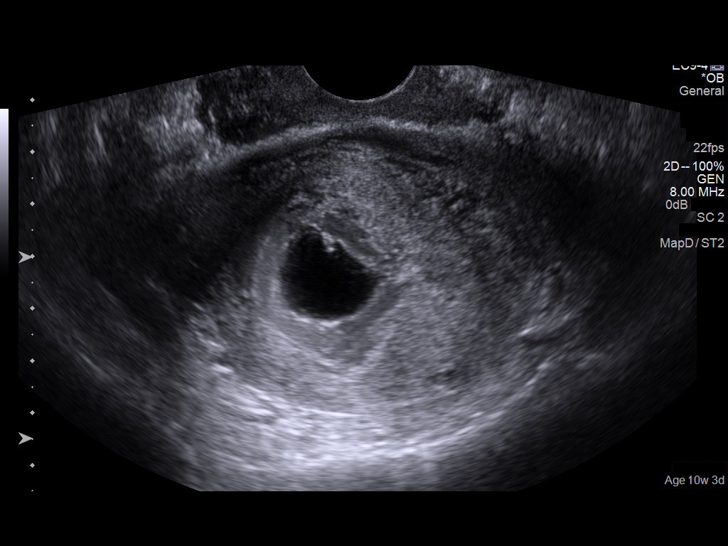
[im 9/33]
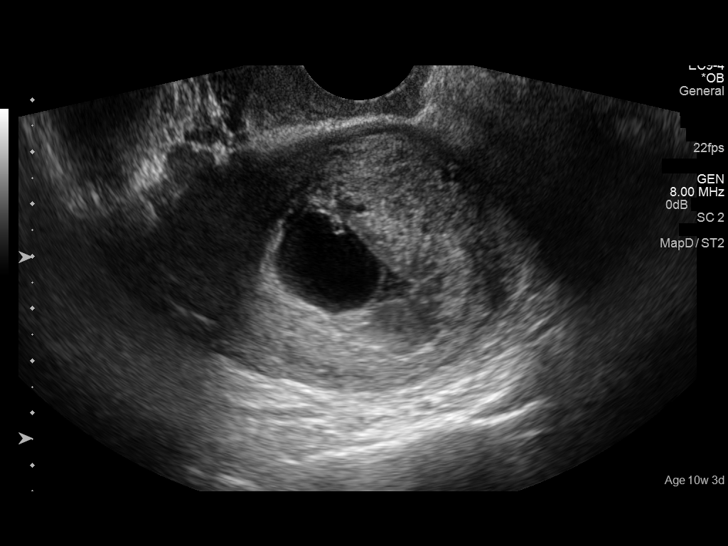
[im 11/33]
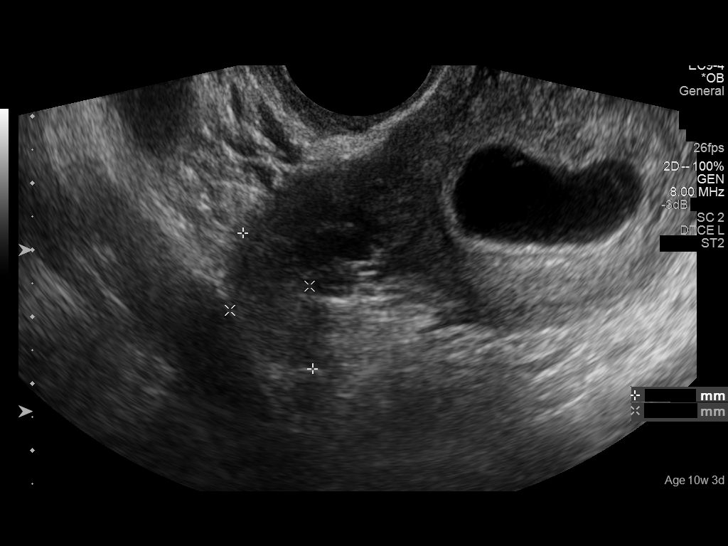
[im 14/33]
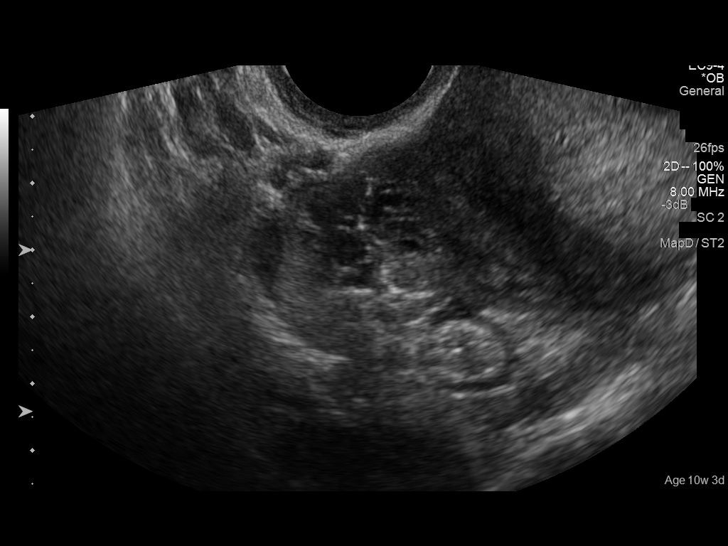
[im 16/33]
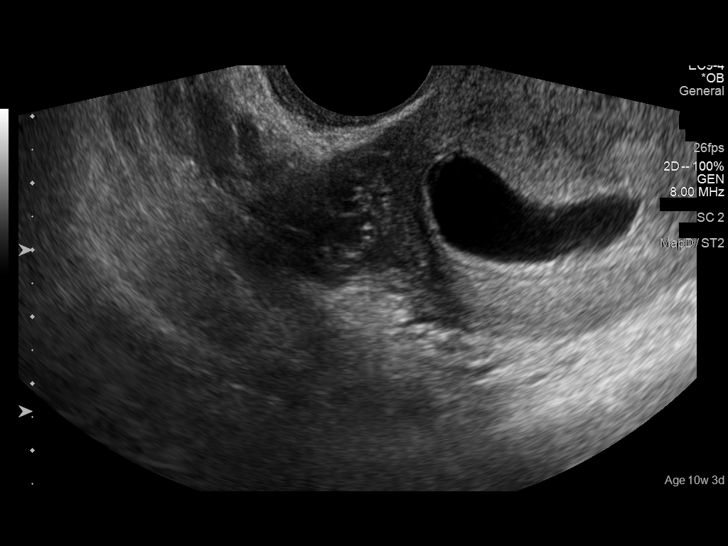
[im 18/33]
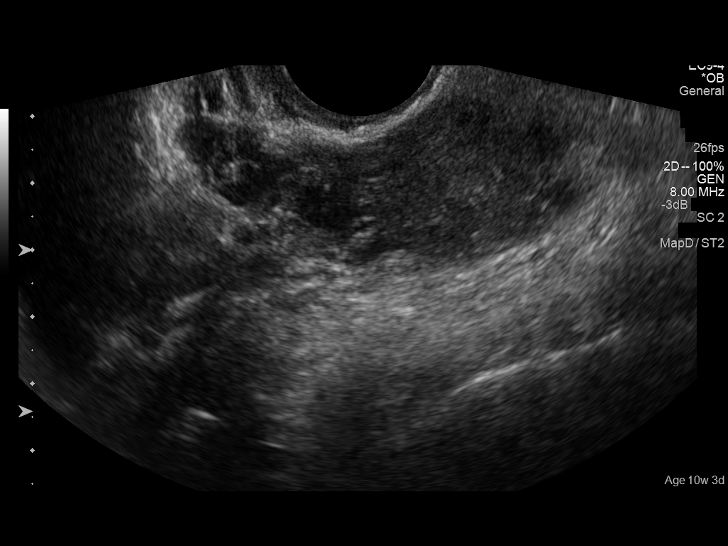
[im 21/33]
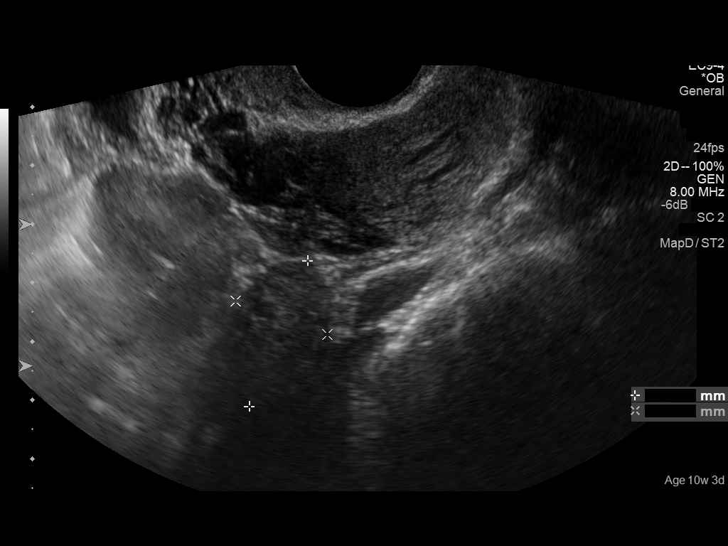
[im 23/33]
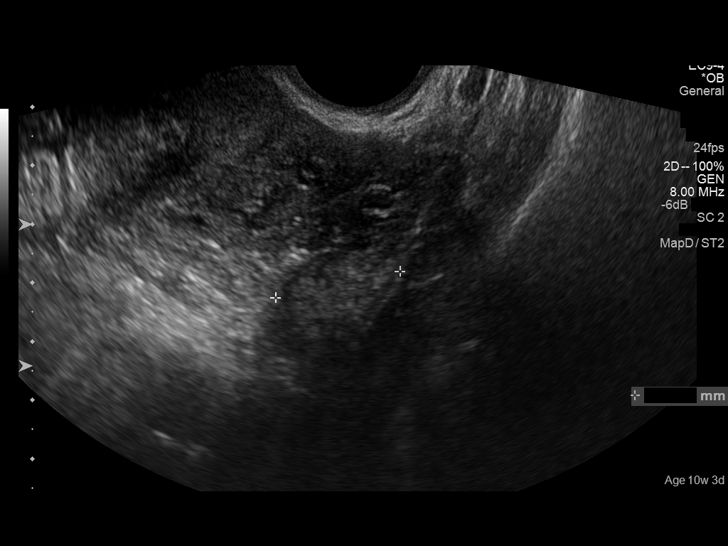
[im 25/33]
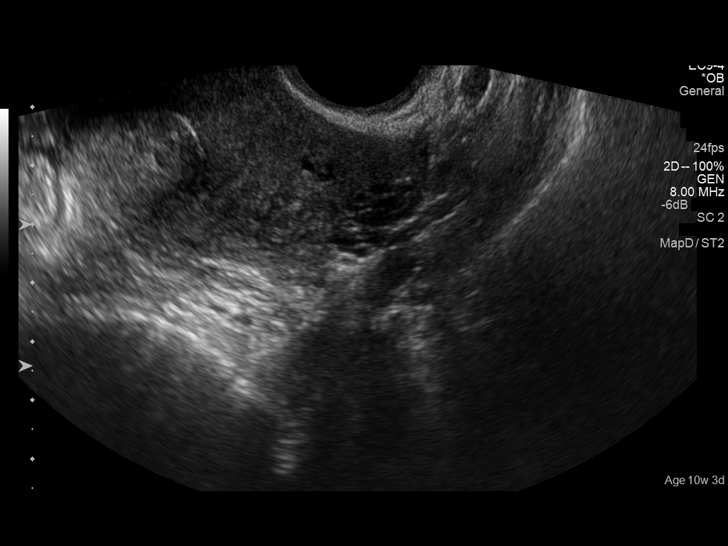
[im 28/33]
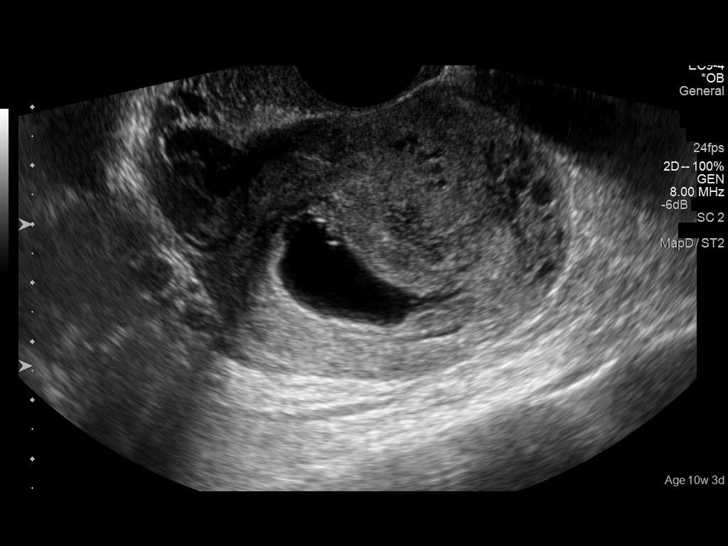
[im 30/33]
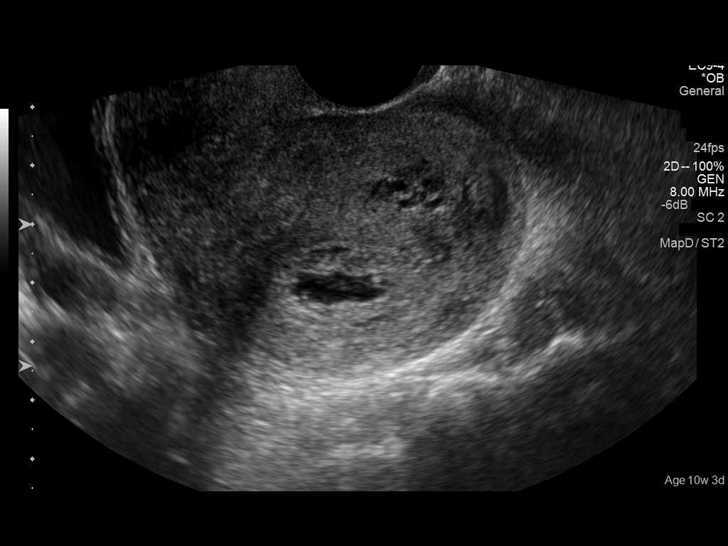
[im 33/33]
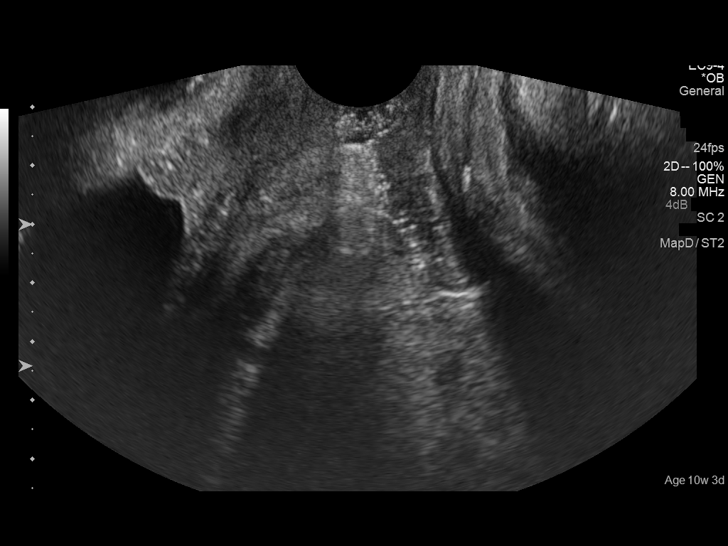

[14 of 28 positions shown; findings below may reference images not displayed]

FINDINGS: Intrauterine gestational sac: Gestational sac is irregular.

Yolk sac:  Present.

Embryo:  None identified.

MSD: 23.2  mm   7 w   2  d

Maternal uterus/adnexae: Uterus and adnexa normal.  No free fluid.
IMPRESSION: Irregular gestational sac containing a fetal pole. Yolk sac appears
to be present. Differential diagnosis includes an ovulatory
pregnancy, missed abortion, and early intrauterine pregnancy. .
Follow-up beta HCG and pelvic ultrasound suggested. Findings are
suspicious but not yet definitive for failed pregnancy. Recommend
follow-up US in 10-14 days for definitive diagnosis. This
recommendation follows SRU consensus guidelines: Diagnostic Criteria
for Nonviable Pregnancy Early in the First Trimester. N Engl J Med

## 2015-08-09 ENCOUNTER — Ambulatory Visit (INDEPENDENT_AMBULATORY_CARE_PROVIDER_SITE_OTHER): Payer: Self-pay | Admitting: Licensed Clinical Social Worker

## 2015-08-09 DIAGNOSIS — Z658 Other specified problems related to psychosocial circumstances: Secondary | ICD-10-CM

## 2015-08-09 DIAGNOSIS — F439 Reaction to severe stress, unspecified: Secondary | ICD-10-CM

## 2015-08-09 DIAGNOSIS — T7491XD Unspecified adult maltreatment, confirmed, subsequent encounter: Secondary | ICD-10-CM
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
## 2015-08-11 ENCOUNTER — Encounter: Payer: Self-pay | Admitting: Internal Medicine

## 2015-08-11 ENCOUNTER — Ambulatory Visit (INDEPENDENT_AMBULATORY_CARE_PROVIDER_SITE_OTHER): Payer: Medicaid Other | Admitting: Internal Medicine

## 2015-08-11 VITALS — BP 110/62 | HR 74 | Resp 16 | Ht 62.25 in | Wt 118.0 lb

## 2015-08-11 DIAGNOSIS — Z0000 Encounter for general adult medical examination without abnormal findings: Secondary | ICD-10-CM

## 2015-08-11 DIAGNOSIS — Z124 Encounter for screening for malignant neoplasm of cervix: Secondary | ICD-10-CM

## 2015-08-11 DIAGNOSIS — Z1239 Encounter for other screening for malignant neoplasm of breast: Secondary | ICD-10-CM | POA: Diagnosis not present
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
## 2015-08-13 LAB — CYTOLOGY - PAP

## 2015-08-17 ENCOUNTER — Other Ambulatory Visit: Payer: Self-pay

## 2015-08-17 DIAGNOSIS — Z1239 Encounter for other screening for malignant neoplasm of breast: Secondary | ICD-10-CM

## 2015-08-17 DIAGNOSIS — Z1211 Encounter for screening for malignant neoplasm of colon: Secondary | ICD-10-CM

## 2015-08-17 LAB — POC HEMOCCULT BLD/STL (HOME/3-CARD/SCREEN)
Card #2 Fecal Occult Blod, POC: NEGATIVE
Card #3 Fecal Occult Blood, POC: NEGATIVE
FECAL OCCULT BLD: NEGATIVE

## 2015-08-20 ENCOUNTER — Other Ambulatory Visit (INDEPENDENT_AMBULATORY_CARE_PROVIDER_SITE_OTHER): Payer: Medicaid Other

## 2015-08-20 DIAGNOSIS — K921 Melena: Secondary | ICD-10-CM

## 2015-08-20 DIAGNOSIS — Z13220 Encounter for screening for lipoid disorders: Secondary | ICD-10-CM

## 2015-08-20 DIAGNOSIS — R300 Dysuria: Secondary | ICD-10-CM | POA: Diagnosis not present

## 2015-08-20 LAB — POCT URINALYSIS DIPSTICK
Bilirubin, UA: NEGATIVE
GLUCOSE UA: NEGATIVE
Ketones, UA: NEGATIVE
Leukocytes, UA: NEGATIVE
NITRITE UA: NEGATIVE
PROTEIN UA: NEGATIVE
RBC UA: NEGATIVE
Spec Grav, UA: 1.015
UROBILINOGEN UA: 0.2
pH, UA: 5

## 2015-08-21 ENCOUNTER — Telehealth: Payer: Self-pay | Admitting: Internal Medicine

## 2015-08-21 LAB — CBC WITH DIFFERENTIAL/PLATELET
BASOS: 0 %
Basophils Absolute: 0 10*3/uL (ref 0.0–0.2)
EOS (ABSOLUTE): 0.1 10*3/uL (ref 0.0–0.4)
Eos: 2 %
HEMATOCRIT: 40.3 % (ref 34.0–46.6)
Hemoglobin: 12.9 g/dL (ref 11.1–15.9)
Immature Grans (Abs): 0 10*3/uL (ref 0.0–0.1)
Immature Granulocytes: 0 %
Lymphocytes Absolute: 2.4 10*3/uL (ref 0.7–3.1)
Lymphs: 35 %
MCH: 25.4 pg — AB (ref 26.6–33.0)
MCHC: 32 g/dL (ref 31.5–35.7)
MCV: 79 fL (ref 79–97)
MONOS ABS: 0.3 10*3/uL (ref 0.1–0.9)
Monocytes: 4 %
Neutrophils Absolute: 4 10*3/uL (ref 1.4–7.0)
Neutrophils: 59 %
Platelets: 198 10*3/uL (ref 150–379)
RBC: 5.08 x10E6/uL (ref 3.77–5.28)
RDW: 14.3 % (ref 12.3–15.4)
WBC: 6.9 10*3/uL (ref 3.4–10.8)

## 2015-08-21 LAB — LIPID PANEL W/O CHOL/HDL RATIO
Cholesterol, Total: 201 mg/dL — ABNORMAL HIGH (ref 100–199)
HDL: 57 mg/dL (ref >39)
LDL Calculated: 93 mg/dL (ref 0–99)
Triglycerides: 253 mg/dL — ABNORMAL HIGH (ref 0–149)
VLDL Cholesterol Cal: 51 mg/dL — ABNORMAL HIGH (ref 5–40)

## 2015-08-23 ENCOUNTER — Other Ambulatory Visit: Payer: Medicaid Other | Admitting: Licensed Clinical Social Worker

## 2015-08-25 ENCOUNTER — Other Ambulatory Visit (INDEPENDENT_AMBULATORY_CARE_PROVIDER_SITE_OTHER): Payer: Self-pay | Admitting: Licensed Clinical Social Worker

## 2015-08-25 DIAGNOSIS — T7491XD Unspecified adult maltreatment, confirmed, subsequent encounter: Secondary | ICD-10-CM

## 2015-08-25 DIAGNOSIS — F439 Reaction to severe stress, unspecified: Secondary | ICD-10-CM

## 2015-08-25 DIAGNOSIS — Z658 Other specified problems related to psychosocial circumstances: Secondary | ICD-10-CM
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
## 2015-08-27 NOTE — Telephone Encounter (Signed)
[name] informed.

## 2016-04-07 ENCOUNTER — Ambulatory Visit (INDEPENDENT_AMBULATORY_CARE_PROVIDER_SITE_OTHER): Payer: Medicaid Other | Admitting: Internal Medicine

## 2016-04-07 ENCOUNTER — Encounter: Payer: Self-pay | Admitting: Internal Medicine

## 2016-04-07 VITALS — BP 108/72 | HR 76 | Temp 98.3°F | Resp 12 | Ht 62.0 in | Wt 119.0 lb

## 2016-04-07 DIAGNOSIS — R197 Diarrhea, unspecified: Secondary | ICD-10-CM | POA: Diagnosis not present

## 2016-04-07 DIAGNOSIS — T7491XD Unspecified adult maltreatment, confirmed, subsequent encounter: Secondary | ICD-10-CM

## 2016-04-07 DIAGNOSIS — L509 Urticaria, unspecified: Secondary | ICD-10-CM | POA: Diagnosis not present

## 2016-04-07 MED ORDER — LORATADINE 10 MG PO TABS: 10.0000 mg | ORAL_TABLET | Freq: Every day | ORAL | 11 refills | Status: DC
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
## 2016-04-24 NOTE — Addendum Note (Signed)
Addended by: Chett Taniguchi on: 04/24/2016 02:41 PM   Modules accepted: Level of Service  

## 2016-04-24 NOTE — Addendum Note (Signed)
Addended by: Eeva Schlosser on: 04/24/2016 02:34 PM   Modules accepted: Level of Service  

## 2016-04-24 NOTE — Addendum Note (Signed)
Addended by: Nilda SimmerKNIGHT, Cornelia Walraven on: 04/24/2016 02:33 PM   Modules accepted: Level of Service

## 2016-04-25 NOTE — Addendum Note (Signed)
Addended by: Nilda SimmerKNIGHT, Makia Bossi on: 04/25/2016 03:49 PM   Modules accepted: Level of Service

## 2016-05-10 ENCOUNTER — Telehealth: Payer: Self-pay | Admitting: Internal Medicine

## 2016-05-10 NOTE — Telephone Encounter (Signed)
Ree Moo called patient have pain in left arm for last two days.

## 2016-05-11 NOTE — Telephone Encounter (Signed)
Spoke with [name]. Patient has appointment for 2 pm on 05/12/16

## 2016-05-12 ENCOUNTER — Encounter: Payer: Self-pay | Admitting: Internal Medicine

## 2016-05-12 ENCOUNTER — Ambulatory Visit (INDEPENDENT_AMBULATORY_CARE_PROVIDER_SITE_OTHER): Payer: Medicaid Other | Admitting: Internal Medicine

## 2016-05-12 VITALS — BP 102/80 | HR 74 | Resp 12 | Ht 62.0 in | Wt 121.0 lb

## 2016-05-12 DIAGNOSIS — R51 Headache: Secondary | ICD-10-CM

## 2016-05-12 DIAGNOSIS — R519 Headache, unspecified: Secondary | ICD-10-CM

## 2016-05-12 DIAGNOSIS — M7712 Lateral epicondylitis, left elbow: Secondary | ICD-10-CM

## 2016-05-12 MED ORDER — IBUPROFEN 600 MG PO TABS: ORAL_TABLET | ORAL | 0 refills | Status: DC

## 2016-05-12 NOTE — Progress Notes (Signed)
   Subjective:    Patient ID: Rushie NyhanBaw Nicolini, female    DOB: 06/07/1970, 46 y.o.   MRN: [id number]  HPI   Left elbow pain;  Problem for 1 month.  No history of injury.  She is a reportedly restless sleeper and perhaps may have hit her elbow in her sleep.  When tries to pick things up, her elbow hurts.   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  She is not clear as to how much she uses her left arm with this motion.   No definite repetitive motion at home with her arm.   Has applied warm compress with minimal improvement.    No outpatient prescriptions have been marked as taking for the 05/12/16 encounter (Office Visit) with Julieanne MansonElizabeth Elnora Quizon, MD.    No Known Allergies     Review of Systems     Objective:   Physical Exam   Left elbow:  Tender over lateral epicondyle and tendinous insertion here.  Pain with supination and pronation against an opposing force.        Assessment & Plan:  Left lateral epicondylitis:  Ibuprofen twice daily with meals for 2 weeks, then as needed.  Arm splint to immobilize tendon below elbow.

## 2016-07-01 ENCOUNTER — Emergency Department (HOSPITAL_COMMUNITY)
Admission: EM | Admit: 2016-07-01 | Discharge: 2016-07-01 | Disposition: A | Discharge status: Home / Self Care | Payer: Medicaid Other | Attending: Emergency Medicine | Admitting: Emergency Medicine

## 2016-07-01 ENCOUNTER — Encounter (HOSPITAL_COMMUNITY): Payer: Self-pay

## 2016-07-01 DIAGNOSIS — E860 Dehydration: Secondary | ICD-10-CM | POA: Insufficient documentation

## 2016-07-01 DIAGNOSIS — R109 Unspecified abdominal pain: Secondary | ICD-10-CM | POA: Insufficient documentation

## 2016-07-01 DIAGNOSIS — R51 Headache: Secondary | ICD-10-CM | POA: Insufficient documentation

## 2016-07-01 DIAGNOSIS — R197 Diarrhea, unspecified: Secondary | ICD-10-CM

## 2016-07-01 DIAGNOSIS — I10 Essential (primary) hypertension: Secondary | ICD-10-CM | POA: Insufficient documentation

## 2016-07-01 DIAGNOSIS — R519 Headache, unspecified: Secondary | ICD-10-CM

## 2016-07-01 HISTORY — DX: Essential (primary) hypertension: I10

## 2016-07-01 LAB — CBC
HCT: 40.3 % (ref 36.0–46.0)
HEMOGLOBIN: 13.2 g/dL (ref 12.0–15.0)
MCH: 25.3 pg — AB (ref 26.0–34.0)
MCHC: 32.8 g/dL (ref 30.0–36.0)
MCV: 77.4 fL — AB (ref 78.0–100.0)
Platelets: 188 10*3/uL (ref 150–400)
RBC: 5.21 MIL/uL — AB (ref 3.87–5.11)
RDW: 13.6 % (ref 11.5–15.5)
WBC: 6 10*3/uL (ref 4.0–10.5)

## 2016-07-01 LAB — URINALYSIS, ROUTINE W REFLEX MICROSCOPIC
Bilirubin Urine: NEGATIVE
GLUCOSE, UA: NEGATIVE mg/dL
HGB URINE DIPSTICK: NEGATIVE
Ketones, ur: NEGATIVE mg/dL
Leukocytes, UA: NEGATIVE
Nitrite: NEGATIVE
Protein, ur: NEGATIVE mg/dL
SPECIFIC GRAVITY, URINE: 1.023 (ref 1.005–1.030)
pH: 5 (ref 5.0–8.0)

## 2016-07-01 LAB — COMPREHENSIVE METABOLIC PANEL
ALBUMIN: 3.9 g/dL (ref 3.5–5.0)
ALK PHOS: 33 U/L — AB (ref 38–126)
ALT: 23 U/L (ref 14–54)
ANION GAP: 5 (ref 5–15)
AST: 25 U/L (ref 15–41)
BILIRUBIN TOTAL: 0.5 mg/dL (ref 0.3–1.2)
BUN: 16 mg/dL (ref 6–20)
CHLORIDE: 107 mmol/L (ref 101–111)
CO2: 27 mmol/L (ref 22–32)
Calcium: 8.3 mg/dL — ABNORMAL LOW (ref 8.9–10.3)
Creatinine, Ser: 0.88 mg/dL (ref 0.44–1.00)
GFR calc non Af Amer: >60 mL/min (ref >60)
Glucose, Bld: 100 mg/dL — ABNORMAL HIGH (ref 65–99)
Potassium: 3.5 mmol/L (ref 3.5–5.1)
SODIUM: 139 mmol/L (ref 135–145)
Total Protein: 7.5 g/dL (ref 6.5–8.1)

## 2016-07-01 LAB — I-STAT BETA HCG BLOOD, ED (MC, WL, AP ONLY)

## 2016-07-01 LAB — LIPASE, BLOOD: Lipase: 36 U/L (ref 11–51)

## 2016-07-01 MED ORDER — PROCHLORPERAZINE EDISYLATE 5 MG/ML IJ SOLN
10.0000 mg | Freq: Once | INTRAMUSCULAR | Status: AC
  Administered 2016-07-01: 10 mg via INTRAVENOUS
  Filled 2016-07-01: qty 2

## 2016-07-01 MED ORDER — DEXAMETHASONE SODIUM PHOSPHATE 10 MG/ML IJ SOLN
10.0000 mg | Freq: Once | INTRAMUSCULAR | Status: AC
  Administered 2016-07-01: 10 mg via INTRAVENOUS
  Filled 2016-07-01: qty 1

## 2016-07-01 MED ORDER — SODIUM CHLORIDE 0.9 % IV BOLUS (SEPSIS)
1000.0000 mL | Freq: Once | INTRAVENOUS | Status: AC
  Administered 2016-07-01: 1000 mL via INTRAVENOUS

## 2016-07-01 MED ORDER — ONDANSETRON 4 MG PO TBDP: 4.0000 mg | ORAL_TABLET | Freq: Three times a day (TID) | ORAL | 0 refills | Status: DC | PRN

## 2016-07-01 MED ORDER — DIPHENHYDRAMINE HCL 50 MG/ML IJ SOLN
25.0000 mg | Freq: Once | INTRAMUSCULAR | Status: AC
  Administered 2016-07-01: 25 mg via INTRAVENOUS
  Filled 2016-07-01: qty 1

## 2016-07-01 NOTE — ED Notes (Signed)
Pt states "My head and stomach feel much better"

## 2016-07-01 NOTE — ED Notes (Signed)
[facility] for review of d/c instructions by this RN and Dr Rush Landmarkegeler. Pt has a pcp and is to follow up with them this week. VSS, NAD at time of d/c. Pt verbalizes that her headache, dizziness and stomach discomfort is gone. Pt d/c home with son driving. Prescription zofran reviewed and pt understands use of it.
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
## 2016-07-01 NOTE — Discharge Instructions (Signed)
Please stay hydrated and take her nausea medicine for nausea. Please follow-up with your PCP. If any symptoms worsen, please return to the nearest ED.

## 2016-07-01 NOTE — ED Triage Notes (Signed)
Patient here with abdominal pain that she describes as cramping pain with headache x 1 day. No nausea, no vomiting. Family member assisting with translation

## 2016-07-07 ENCOUNTER — Ambulatory Visit (INDEPENDENT_AMBULATORY_CARE_PROVIDER_SITE_OTHER): Payer: Medicaid Other | Admitting: Internal Medicine

## 2016-07-07 ENCOUNTER — Encounter: Payer: Self-pay | Admitting: Internal Medicine

## 2016-07-07 VITALS — BP 94/70 | HR 70 | Resp 12 | Ht 62.0 in | Wt 119.0 lb

## 2016-07-07 DIAGNOSIS — R42 Dizziness and giddiness: Secondary | ICD-10-CM | POA: Diagnosis not present

## 2016-07-07 NOTE — Progress Notes (Signed)
   Subjective:    Patient ID: [name] Ebner, female    DOB: 12/20/1970, 46 y.o.   MRN: [id number]  HPI   Was seen in ED on 07/01/2016 for diarrhea, headache and light headedness.  Diarrhea resolved the day before she went into ED.   Still with dizziness at times.   Was getting better until this morning when she had about 10 minutes of light headedness.  Was out walking outside and inside when this occurred.  Thought the lights were "going out".  Appetite is not good, but trying to eat and drink. Drank water and coffee this morning.  Potato chips also this morning. Normally hawaiian roll and coffee.    Current Meds  Medication Sig  . ondansetron (ZOFRAN ODT) 4 MG disintegrating tablet Take 1 tablet (4 mg total) by mouth every 8 (eight) hours as needed for nausea or vomiting.    No Known Allergies   Review of Systems     Objective:   Physical Exam   NAD HEENT:  PERRL, EOMI, not able to see discs well. TMs pearly gray, throat without injection.  MMM Neck:  Supple, No adenopathy Chest:  CTA CV:  RRR with normal S1, S2, No S3, S4, or murmur, radial pulses normal and equal Abd:  S, Mild tenderness diffusely, no rebound or peritoneal signs, + BS, No HSM or mass. Neuro:  A & O x 3, no focal findings        Assessment & Plan:  1.  Light Headedness:  Not really hydrating, though not orthostatic by bp or pulse either.  To work on drinking fluids throughout the day.  Avoid junk food when she has had difficulty with diarrhea. Labs at the hospital were really unremarkable:  CBC, CMP

## 2016-07-07 NOTE — Patient Instructions (Signed)
Drink water all day long.  Avoid junk food

## 2016-12-13 ENCOUNTER — Other Ambulatory Visit: Payer: Self-pay | Admitting: Internal Medicine

## 2016-12-13 ENCOUNTER — Ambulatory Visit (INDEPENDENT_AMBULATORY_CARE_PROVIDER_SITE_OTHER): Payer: Self-pay | Admitting: Internal Medicine

## 2016-12-13 ENCOUNTER — Encounter: Payer: Self-pay | Admitting: Internal Medicine

## 2016-12-13 VITALS — BP 118/80 | HR 70 | Temp 98.0°F | Resp 12 | Ht 62.0 in | Wt 118.0 lb

## 2016-12-13 DIAGNOSIS — R1032 Left lower quadrant pain: Secondary | ICD-10-CM

## 2016-12-13 DIAGNOSIS — R51 Headache: Principal | ICD-10-CM

## 2016-12-13 DIAGNOSIS — R519 Headache, unspecified: Secondary | ICD-10-CM

## 2016-12-13 DIAGNOSIS — B349 Viral infection, unspecified: Secondary | ICD-10-CM

## 2016-12-13 DIAGNOSIS — K5900 Constipation, unspecified: Secondary | ICD-10-CM

## 2016-12-13 LAB — POCT URINALYSIS DIPSTICK
BILIRUBIN UA: NEGATIVE
GLUCOSE UA: NEGATIVE
KETONES UA: NEGATIVE
Leukocytes, UA: NEGATIVE
Nitrite, UA: NEGATIVE
PH UA: 6 (ref 5.0–8.0)
Protein, UA: 15
RBC UA: NEGATIVE
SPEC GRAV UA: 1.02 (ref 1.010–1.025)
Urobilinogen, UA: 0.2 E.U./dL

## 2016-12-13 MED ORDER — DOCUSATE SODIUM 100 MG PO CAPS: 100.0000 mg | ORAL_CAPSULE | Freq: Two times a day (BID) | ORAL | 0 refills | Status: AC

## 2016-12-13 NOTE — Patient Instructions (Signed)
Drink small amounts of water frequently --you should be drinking 6-8 glasses of water daily.

## 2016-12-13 NOTE — Progress Notes (Signed)
   Subjective:    Patient ID: [name], female    DOB: 07/09/1970, 46 y.o.   MRN: [id number]  HPI  Palu Reh, her youngest son, interprets today.  They are contemplating moving to a renovated townhome.  Here for illness Started with a headache  7 days ago in nuchal area which radiated up over top of head to frontal area bilaterally, though really just hurting on right with posterior head.   Started vomiting the 5th day of the headache.  She had continued to work, but when returned home that evening, vomited 3-4 times.  First was undigested food she had just eaten and eventually, just yellow liquid.   Yesterday morning, was still nauseated, but as had no further vomiting.   No nausea since yesterday afternoon but now with burning pain in LLQ.  The latter actually started 2 days ago with the vomiting.  She feels the pain is worsening.   No diarrhea.  Her stools are actually hard and dry.  No melena or hematochezia.   Has not had much of appetite.  Had some bread yesterday and today.  Is drinking water--small amounts.   Urinating okay, but urine is dark yellow.  No dysuria or frequency. No nasal congestion, ear pain or sore throat. No fever, but feels cold.    No outpatient prescriptions have been marked as taking for the 12/13/16 encounter (Office Visit) with Julieanne MansonMulberry, Shakeyla Giebler, MD.    No Known Allergies   Review of Systems     Objective:   Physical Exam NAD HEENT: PERRL, EOMI, discs sharp, TMs pearly gray, throat without injection. Nasal mucosa with mild swelling and redness.  No discharge Neck: Supple, No adenopathy. Lungs:  CTA CV:  RRR with normal S1 and S2, No murmur or rub, radial and DP pulses normal and equal Abd:  S, mild tenderness LLQ, No HSM or mass, though palpable stool in left abdomen, + BS Skin:  No rash.       Assessment & Plan:  1.  Viral Syndrome:  Small amounts of clear liquids frequently to rehydrate.  Gradually advance oral intake as tolerated.  To avoid  heavy, greasy foods.  Ibuprofen for headache as needed--try eating small amount of saltines.    2.  LLQ/likely related to constipation:  When able to take more fluids/solids, to increase to 6-8 glasses of water daily.  5 servings of veggies and 2 of fruit daily.  May use Dulcolax 100 mg twice daily as needed for constipation.

## 2017-02-23 IMAGING — CT CT HEAD W/O CM
2 series · 16 of 30 positions shown, 20 images · non-contrast
Comparison: 03/11/2013

CLINICAL DATA: Headache for 1 week

EXAM:
CT HEAD WITHOUT CONTRAST
TECHNIQUE: Contiguous axial images were obtained from the base of the skull
through the vertex without intravenous contrast.

[Series 201: head w/o, idose (1) · axial · non-contrast · 0.49mm/px · z∈[+96,+216]mm · 13 of 28 slices shown, 17 images]
[im 2/28  brain]
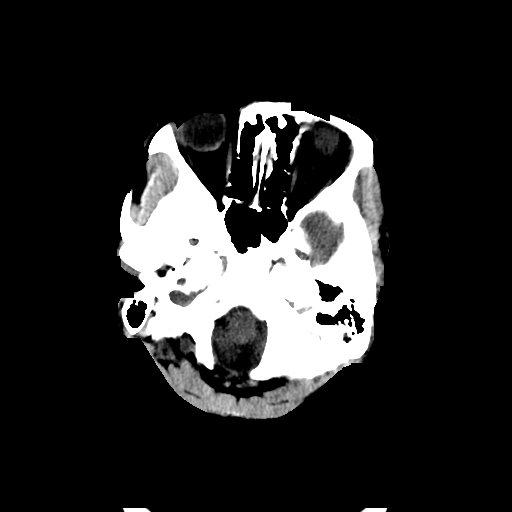
[im 2/28  bone]
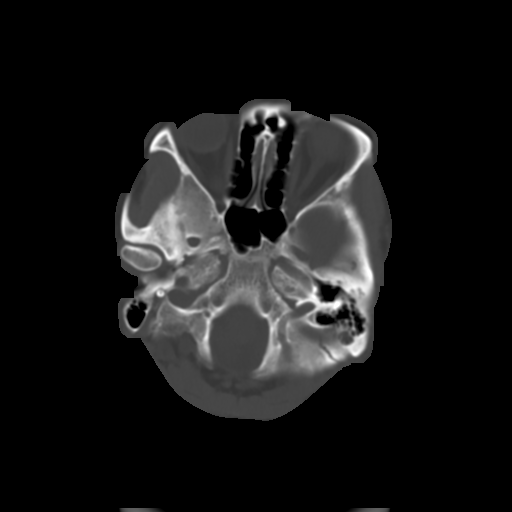
[im 4/28  brain]
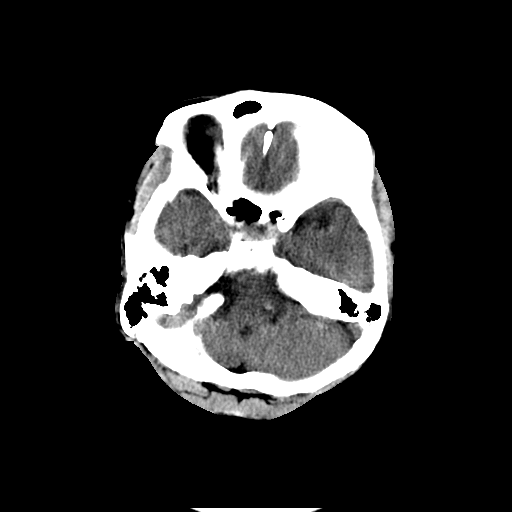
[im 6/28  brain]
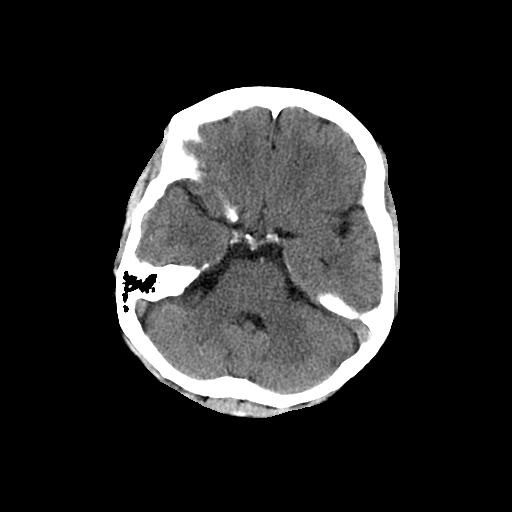
[im 8/28  brain]
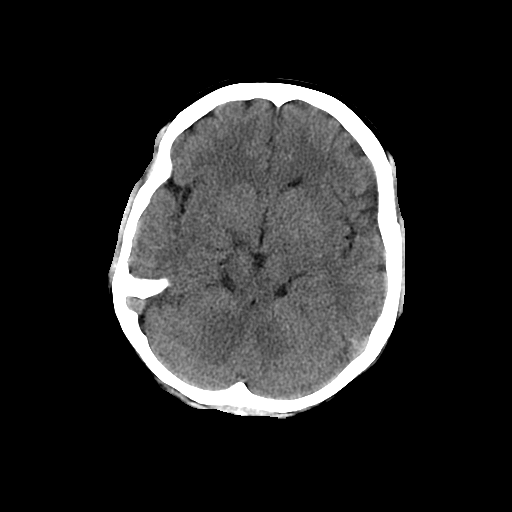
[im 10/28  brain]
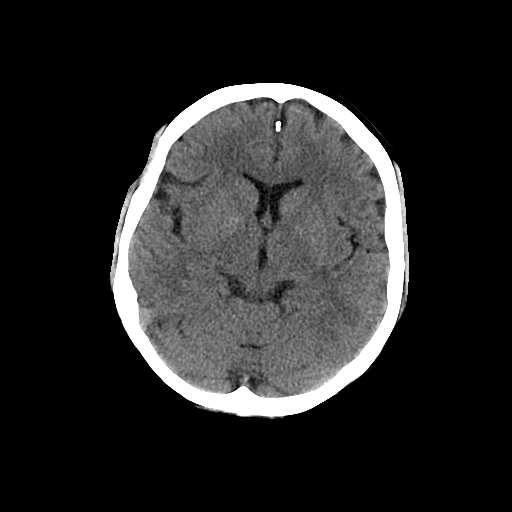
[im 10/28  bone]
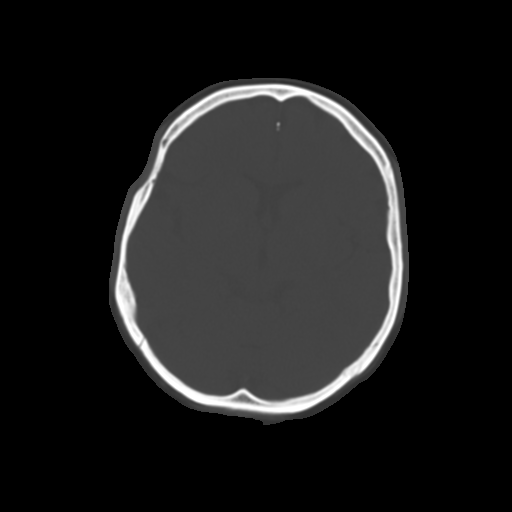
[im 12/28  brain]
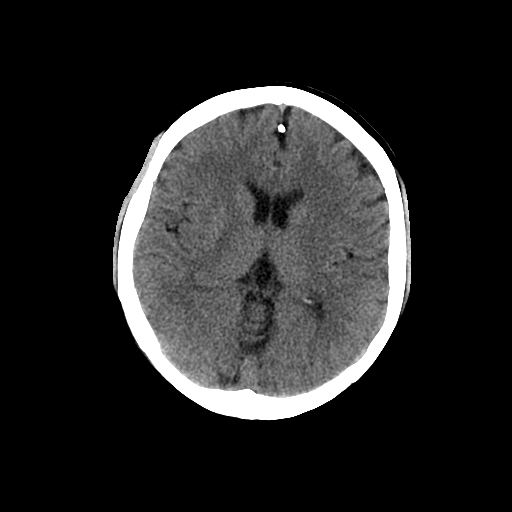
[im 14/28  brain]
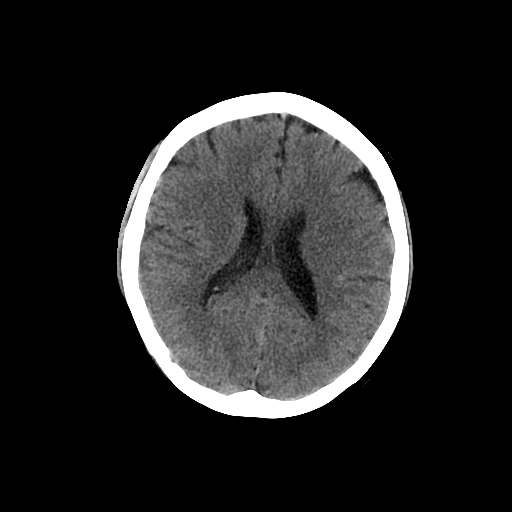
[im 16/28  brain]
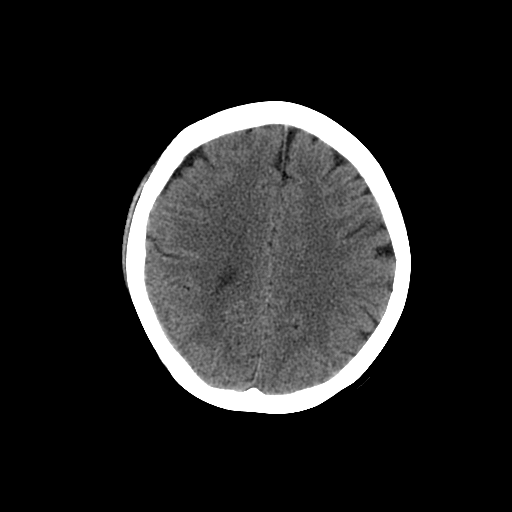
[im 18/28  brain]
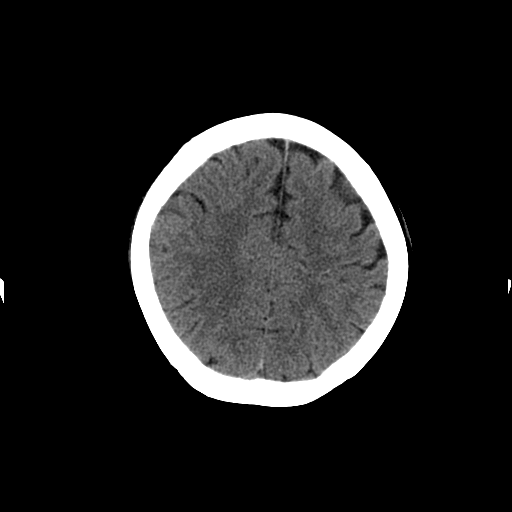
[im 18/28  bone]
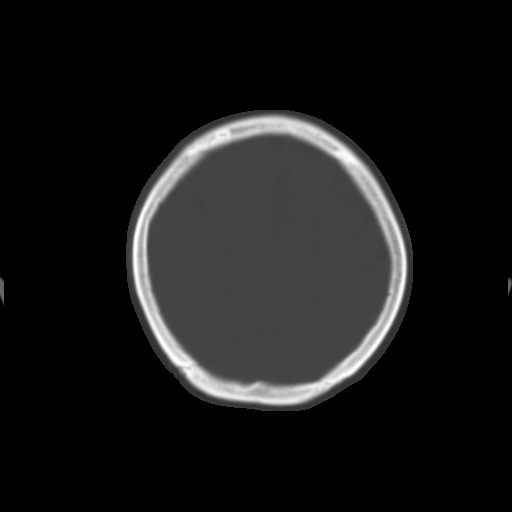
[im 20/28  brain]
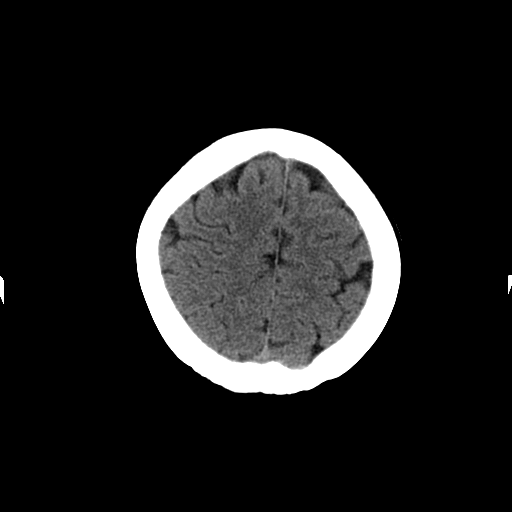
[im 22/28  brain]
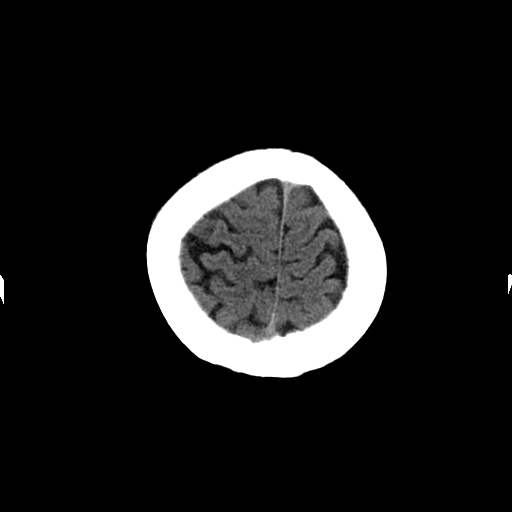
[im 24/28  brain]
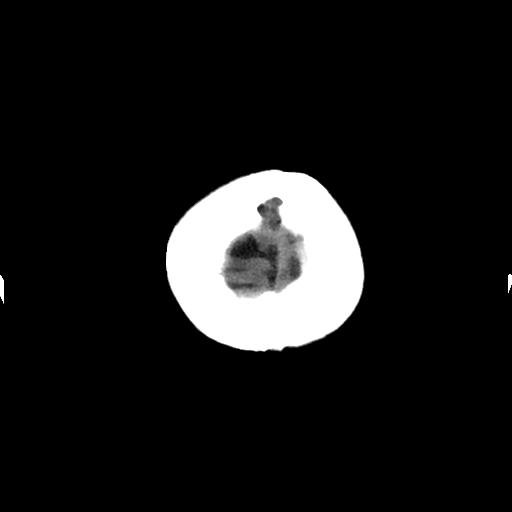
[im 26/28  brain]
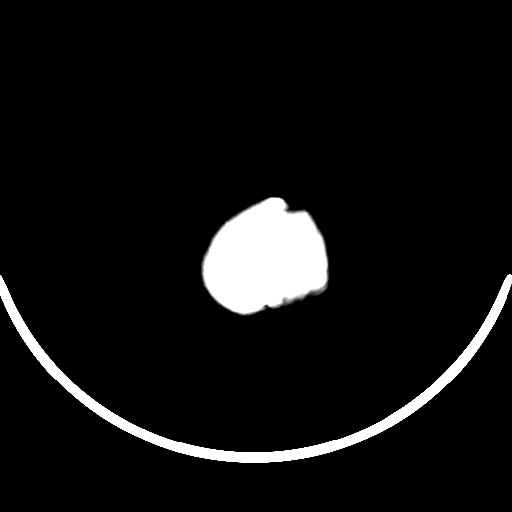
[im 26/28  bone]
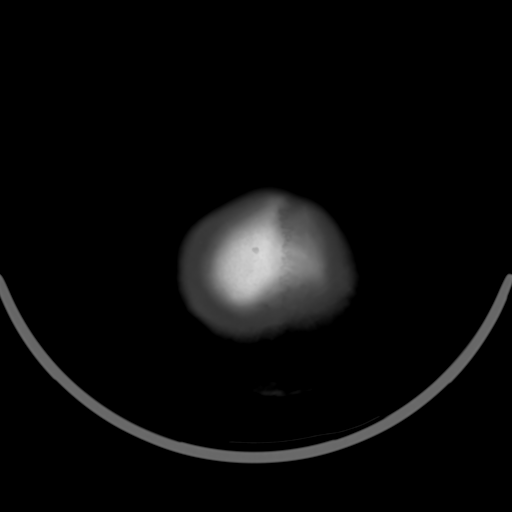

[Series 202: head w/o bone, idose (1) · axial · non-contrast · 0.49mm/px · z∈[+96,+136]mm · 3 of 28 slices shown]
[im 2/28  bone]
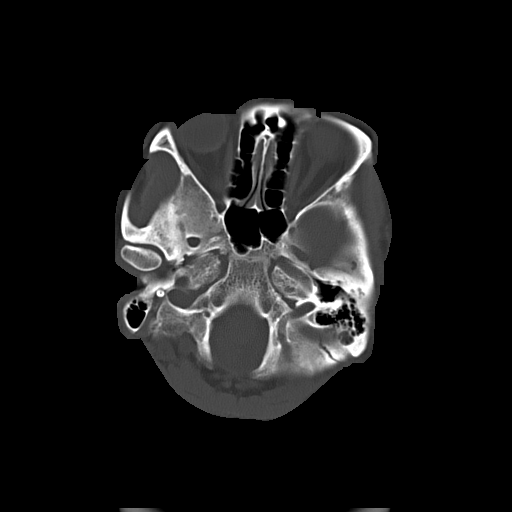
[im 6/28  bone]
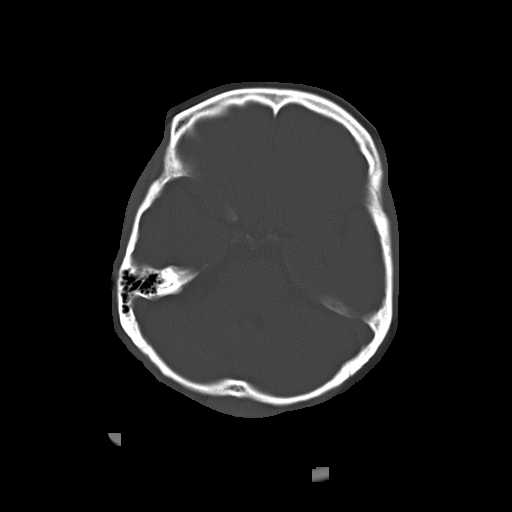
[im 10/28  bone]
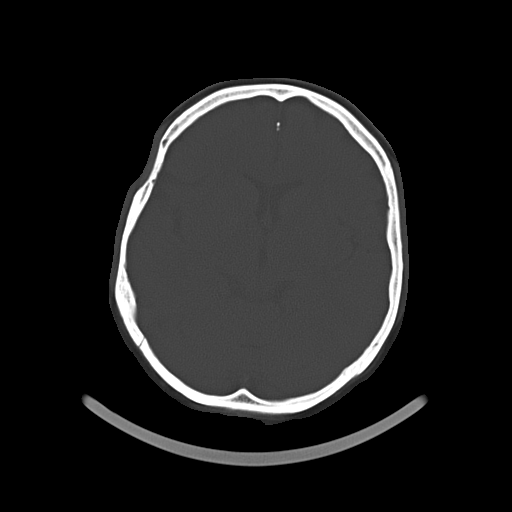

[16 of 30 positions shown; findings below may reference images not displayed]

FINDINGS: No skull fracture is noted. Paranasal sinuses and mastoid air cells
are unremarkable.

No intracranial hemorrhage, mass effect or midline shift.

No acute cortical infarction. No mass lesion is noted on this
unenhanced scan. The gray and white-matter differentiation is
preserved. Ventricular size is stable from prior exam.
IMPRESSION: No acute intracranial abnormality.

## 2017-08-10 ENCOUNTER — Ambulatory Visit: Payer: Self-pay | Admitting: Internal Medicine

## 2017-08-10 ENCOUNTER — Encounter: Payer: Self-pay | Admitting: Internal Medicine

## 2017-08-10 VITALS — BP 128/70 | HR 72 | Resp 12 | Ht 62.0 in | Wt 119.0 lb

## 2017-08-10 DIAGNOSIS — R42 Dizziness and giddiness: Secondary | ICD-10-CM

## 2017-08-10 DIAGNOSIS — R5383 Other fatigue: Secondary | ICD-10-CM

## 2017-08-10 DIAGNOSIS — G4459 Other complicated headache syndrome: Secondary | ICD-10-CM

## 2017-08-10 NOTE — Progress Notes (Signed)
   Subjective:    Patient ID: [name], female    DOB: 03/28/1970, 47 y.o.   MRN: [id number]  HPI   Daughter, Me Duskin interprets.  Bifrontal headaches for past 2 months.  Describes the pain as "hot".  Occur about 2-3 days per week.   Sounds like the headaches can occur at any time. Sometimes they seem to improve with eating, but not always.  Does seem when she skips a meal or is late for a meal that she is getting the headaches. She states she does not eat sometimes as she is not hungry.  Denies a problem with food access.  She often feels shaky, sweaty before the headache comes on.  Can be dizzy as well.  Describe the latter as more spinning and can occur without headache.  Though later, states she almost passes out at work with this, not a sense more so that she will fall or is unstable.  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   She is given 30 minutes to eat and is unable to eat all of her lunch in time at work.  They also get a 15 minute break in the morning. In the morning before work only had coffee and bread.  No significant protein or fat source. Lunch:  Has rice with vegetables.  Does not eat much in way of meat. Daughter, 48 yo, states she is only able to eat once daily.  Apparently they do have difficulties getting enough food, eating ramen noodles frequently without anything else. They do get EBT, but only $290/month for 4 people:  Patient, 48 yo son, 48 yo daughter, 48 yo brother.    She is no longer having periods.  Has not had one since 03/2017.   No melena or hematochezia.    No outpatient medications have been marked as taking for the 08/10/17 encounter (Office Visit) with Julieanne MansonMulberry, Geriann Lafont, MD.   No Known Allergies  Review of Systems     Objective:   Physical Exam   NAD HEENT: PERRL, EOMI, palpebral conjunctivae a bit pale.  Discs sharp, TMs pearly gray, throat without injection. Neck:  Supple, No adenopathy, no  thyromegaly Chest:  CTA CV:  RRR without murmur or rub.  Radial and DP pulses normal and equal Neuro:  A & O x 3, CN  II-XII grossly intact, DTRs 2+/4 throughout, Motor 5/5 throughout, sensory grossly normal.  Rapid alternating motions normal.  Finger-nose-finger normal.  Romberg negative. Gait normal.     Assessment & Plan:  1.  Headaches/dizziness/fatigue:  CBC, CMP, TSH.  Feel, however, patient is not getting enough calories/nutrition and these are her resulting symptoms.   Klaw Ruggerio and Loretha BrasilJayne Lutz notified and will work with patient's teenager regarding bus routes and [place] of getting [place] [employer] for tokens each month with orange card and/or EBT.   Also have call into [employer] for a monthly food pick up for them.  Trying to find out if teenager can pick up for family as mom and older brother working. Also, discussed food pantries [place].   Once food access improved, to not skip or skimp on meals. Follow up in 2 months.

## 2017-08-10 NOTE — Patient Instructions (Signed)
Klaw Kirshner to work with Loretha BrasilJayne Lutz on food insecurity and set up of other avenues to improve this

## 2017-08-11 LAB — CBC WITH DIFFERENTIAL/PLATELET
BASOS ABS: 0 10*3/uL (ref 0.0–0.2)
Basos: 0 %
EOS (ABSOLUTE): 0.1 10*3/uL (ref 0.0–0.4)
EOS: 1 %
HEMATOCRIT: 39.3 % (ref 34.0–46.6)
Hemoglobin: 12.7 g/dL (ref 11.1–15.9)
IMMATURE GRANULOCYTES: 0 %
Immature Grans (Abs): 0 10*3/uL (ref 0.0–0.1)
LYMPHS ABS: 1.7 10*3/uL (ref 0.7–3.1)
Lymphs: 32 %
MCH: 25.2 pg — ABNORMAL LOW (ref 26.6–33.0)
MCHC: 32.3 g/dL (ref 31.5–35.7)
MCV: 78 fL — ABNORMAL LOW (ref 79–97)
MONOS ABS: 0.3 10*3/uL (ref 0.1–0.9)
Monocytes: 6 %
Neutrophils Absolute: 3.2 10*3/uL (ref 1.4–7.0)
Neutrophils: 61 %
PLATELETS: 233 10*3/uL (ref 150–450)
RBC: 5.04 x10E6/uL (ref 3.77–5.28)
RDW: 14.7 % (ref 12.3–15.4)
WBC: 5.3 10*3/uL (ref 3.4–10.8)

## 2017-08-11 LAB — TSH: TSH: 1.62 u[IU]/mL (ref 0.450–4.500)

## 2017-08-11 LAB — COMPREHENSIVE METABOLIC PANEL
ALT: 16 IU/L (ref 0–32)
AST: 17 IU/L (ref 0–40)
Albumin/Globulin Ratio: 1.4 (ref 1.2–2.2)
Albumin: 4.5 g/dL (ref 3.5–5.5)
Alkaline Phosphatase: 47 IU/L (ref 39–117)
BUN/Creatinine Ratio: 14 (ref 9–23)
BUN: 12 mg/dL (ref 6–24)
Bilirubin Total: 0.3 mg/dL (ref 0.0–1.2)
CALCIUM: 9.2 mg/dL (ref 8.7–10.2)
CO2: 27 mmol/L (ref 20–29)
CREATININE: 0.84 mg/dL (ref 0.57–1.00)
Chloride: 104 mmol/L (ref 96–106)
GFR calc Af Amer: 95 mL/min/{1.73_m2} (ref >59)
GFR, EST NON AFRICAN AMERICAN: 82 mL/min/{1.73_m2} (ref >59)
GLOBULIN, TOTAL: 3.2 g/dL (ref 1.5–4.5)
GLUCOSE: 74 mg/dL (ref 65–99)
Potassium: 4.6 mmol/L (ref 3.5–5.2)
SODIUM: 143 mmol/L (ref 134–144)
Total Protein: 7.7 g/dL (ref 6.0–8.5)

## 2017-10-12 ENCOUNTER — Encounter: Payer: Self-pay | Admitting: Internal Medicine

## 2017-10-12 ENCOUNTER — Ambulatory Visit: Payer: Self-pay | Admitting: Internal Medicine

## 2017-10-12 VITALS — BP 118/80 | HR 72 | Resp 12 | Ht 62.0 in | Wt 118.0 lb

## 2017-10-12 DIAGNOSIS — Z5948 Other specified lack of adequate food: Secondary | ICD-10-CM

## 2017-10-12 DIAGNOSIS — F439 Reaction to severe stress, unspecified: Secondary | ICD-10-CM

## 2017-10-12 DIAGNOSIS — G44209 Tension-type headache, unspecified, not intractable: Secondary | ICD-10-CM

## 2017-10-12 DIAGNOSIS — T730XXA Starvation, initial encounter: Secondary | ICD-10-CM

## 2017-10-12 MED ORDER — CYCLOBENZAPRINE HCL 10 MG PO TABS: ORAL_TABLET | ORAL | 0 refills | Status: AC

## 2017-10-12 MED ORDER — IBUPROFEN 200 MG PO TABS: 200.0000 mg | ORAL_TABLET | Freq: Four times a day (QID) | ORAL | 0 refills | Status: DC | PRN

## 2017-10-12 NOTE — Progress Notes (Signed)
   Subjective:    Patient ID: [name], female    DOB: 05/17/1970, 47 y.o.   MRN: [id number]  HPI   Interpreted by [name] [name], [name]  Multifactorial Headaches:  Is eating regularly now and that has significantly cut down on headaches. She does note a specific decrease with headaches since making sure she has breakfast each morning.  Still only eating sweetened breads and coffee, however. Not having them every week currently--hard to estimate frequency. Last Headache was 4 days ago.  Was severe.  Had to go back to bed.  Headache now starting at back of neck and then radiating up over top of head to bifrontal area.  Describes the pain as "hot".  She also describes a squeezing pain. Drinks lemon juice when headache starts and reportedly much better in 10 minutes.   The family did receive support from [employer] in our neighborhood, but funding for that has stopped.  They have not utilized the [employer] with EBT or orange card.    CBC, CMP, TSH last visit in June were normal.  Very stressed as lost job due to transportation issues.  Son still employed and has car.  She is actively looking for a job.  She also has noted tightness in her left posterior leg with stress.  Not sleeping well in past week due to job loss.  No outpatient medications have been marked as taking for the 10/12/17 encounter (Office Visit) with [name], Katelyn Kader, MD.   No Known Allergies     Review of Systems     Objective:   Physical Exam NAD HEENT:  PERRL, EOMI,  Neck:  Supple, but posterior paraspinous cervical musculature tight and tender bilaterally.  Tightness and tenderness extend to traps bilaterally and palpable spasm on left medial to scapula, though nontender here. NT over spinous processes and paraspinous musculature of thoracic and lumbosacral back. Lungs:  CTA CV:  RRR  Neuro:  Unchanged and normal.      Assessment & Plan:  1.  Headaches:  More muscle  tension related at this point.  Will try Ibuprofen 400-600 mg twice daily with meals for 14 days.  Also to use Cyclobenzaprine 5-10 mg at bedtime on nights when unable to sleep.  2.  Stress:  Referral to LCSW, [name].    3.  Food access and transportation:  Information regarding EBT and orange card usage to get up to $25 of food for free weekly at [employer].  Interpreter and family member, [name], will see if he can encourage family members to help get her and rest of family in need to farmer's market.

## 2017-10-26 ENCOUNTER — Other Ambulatory Visit: Payer: Self-pay | Admitting: Licensed Clinical Social Worker

## 2017-10-26 DIAGNOSIS — Z560 Unemployment, unspecified: Secondary | ICD-10-CM

## 2017-10-26 DIAGNOSIS — F439 Reaction to severe stress, unspecified: Secondary | ICD-10-CM
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
## 2017-11-06 ENCOUNTER — Ambulatory Visit: Payer: Self-pay | Admitting: Licensed Clinical Social Worker

## 2017-11-06 DIAGNOSIS — Z560 Unemployment, unspecified: Secondary | ICD-10-CM

## 2017-11-07 NOTE — Progress Notes (Signed)
Cornella Schara walked in to ask LCSW questions about paperwork that had arrived in the mail; she had her daughter-in-law on the phone to interpret. LCSW reviewed the paperwork and explained that it was information about COBRA health insurance. LCSW explained the high cost ($577 per month) to [place] and emphasized that it was an option. LCSW recommended that Jadence Kenyon schedule an appointment with a healthcare navigator to see if she qualifies for ACA coverage, and then to apply for orange card if not. LCSW provided the information about how to schedule a navigator appointment.   LCSW also shared that she has been in contact with an [employer] employment specialist at [employer] for [employer] to see if Syble Oleary could get support from them. LCSW committed to following up with Lekita Fredrick in a week or two.

## 2018-01-11 ENCOUNTER — Ambulatory Visit: Payer: Self-pay | Admitting: Internal Medicine

## 2018-03-24 ENCOUNTER — Encounter: Payer: Self-pay | Admitting: Internal Medicine

## 2019-02-13 ENCOUNTER — Encounter (HOSPITAL_COMMUNITY): Payer: Self-pay | Admitting: Emergency Medicine

## 2019-02-13 ENCOUNTER — Emergency Department (HOSPITAL_COMMUNITY): Payer: Self-pay

## 2019-02-13 ENCOUNTER — Emergency Department (HOSPITAL_COMMUNITY)
Admission: EM | Admit: 2019-02-13 | Discharge: 2019-02-13 | Disposition: A | Discharge status: Home / Self Care | Payer: Self-pay | Attending: Emergency Medicine | Admitting: Emergency Medicine

## 2019-02-13 DIAGNOSIS — Y998 Other external cause status: Secondary | ICD-10-CM | POA: Insufficient documentation

## 2019-02-13 DIAGNOSIS — I10 Essential (primary) hypertension: Secondary | ICD-10-CM | POA: Insufficient documentation

## 2019-02-13 DIAGNOSIS — Y929 Unspecified place or not applicable: Secondary | ICD-10-CM | POA: Insufficient documentation

## 2019-02-13 DIAGNOSIS — F17220 Nicotine dependence, chewing tobacco, uncomplicated: Secondary | ICD-10-CM | POA: Insufficient documentation

## 2019-02-13 DIAGNOSIS — S46911A Strain of unspecified muscle, fascia and tendon at shoulder and upper arm level, right arm, initial encounter: Secondary | ICD-10-CM | POA: Insufficient documentation

## 2019-02-13 DIAGNOSIS — Y939 Activity, unspecified: Secondary | ICD-10-CM | POA: Insufficient documentation

## 2019-02-13 DIAGNOSIS — X58XXXA Exposure to other specified factors, initial encounter: Secondary | ICD-10-CM | POA: Insufficient documentation

## 2019-02-13 MED ORDER — NAPROXEN 500 MG PO TABS: 500.0000 mg | ORAL_TABLET | Freq: Two times a day (BID) | ORAL | 0 refills | Status: AC

## 2019-02-13 MED ORDER — IBUPROFEN 400 MG PO TABS
600.0000 mg | ORAL_TABLET | Freq: Once | ORAL | Status: AC
  Administered 2019-02-13: 14:00:00 600 mg via ORAL
  Filled 2019-02-13: qty 1

## 2019-02-13 MED ORDER — METHOCARBAMOL 500 MG PO TABS: 500.0000 mg | ORAL_TABLET | Freq: Two times a day (BID) | ORAL | 0 refills | Status: AC

## 2019-02-13 MED ORDER — DICLOFENAC SODIUM 1 % EX GEL: 2.0000 g | Freq: Four times a day (QID) | CUTANEOUS | 0 refills | Status: AC

## 2019-02-13 MED ORDER — ACETAMINOPHEN 325 MG PO TABS
650.0000 mg | ORAL_TABLET | Freq: Once | ORAL | Status: AC
  Administered 2019-02-13: 14:00:00 650 mg via ORAL
  Filled 2019-02-13: qty 2

## 2019-02-13 NOTE — Discharge Instructions (Signed)
Take the medications as directed. It is important for you to complete range of motion exercises to prevent stiffness and further pain. Follow-up with your primary care provider. Return to the ED if you start to experience worsening symptoms, injuries or falls, redness or warmth of your joint or numbness to your arms.

## 2019-02-13 NOTE — ED Notes (Signed)
Patient verbalizes understanding of discharge instructions. Opportunity for questioning and answers were provided. Pt discharged from ED. 
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
## 2019-02-13 NOTE — ED Triage Notes (Signed)
Pt to ER for evaluation of right arm pain x1 week at the shoulder radiating to elbow, reproducible with movement, tender to palpation. [name], is accompanied by son who speaks [place].

## 2019-02-13 NOTE — ED Notes (Signed)
Pt transported to xray 

## 2019-03-28 ENCOUNTER — Ambulatory Visit: Payer: Self-pay | Attending: Internal Medicine

## 2019-03-28 DIAGNOSIS — Z20822 Contact with and (suspected) exposure to covid-19: Secondary | ICD-10-CM

## 2019-03-28 DIAGNOSIS — U071 COVID-19: Secondary | ICD-10-CM | POA: Insufficient documentation

## 2019-03-29 LAB — NOVEL CORONAVIRUS, NAA: SARS-CoV-2, NAA: DETECTED — AB

## 2019-04-02 ENCOUNTER — Telehealth: Payer: Self-pay | Admitting: Internal Medicine

## 2019-04-02 NOTE — Telephone Encounter (Signed)
Pt's son called to request that covid-19 results be mailed. Please advise.

## 2019-04-08 ENCOUNTER — Ambulatory Visit: Payer: Self-pay | Attending: Internal Medicine

## 2019-04-08 DIAGNOSIS — Z20822 Contact with and (suspected) exposure to covid-19: Secondary | ICD-10-CM | POA: Insufficient documentation

## 2019-04-09 LAB — NOVEL CORONAVIRUS, NAA: SARS-CoV-2, NAA: NOT DETECTED

## 2020-09-14 ENCOUNTER — Other Ambulatory Visit: Payer: Self-pay

## 2020-09-14 ENCOUNTER — Ambulatory Visit (INDEPENDENT_AMBULATORY_CARE_PROVIDER_SITE_OTHER): Payer: BC Managed Care – PPO | Admitting: Emergency Medicine

## 2020-09-14 ENCOUNTER — Encounter: Payer: Self-pay | Admitting: Emergency Medicine

## 2020-09-14 VITALS — BP 116/82 | HR 98 | Temp 98.5°F | Ht 66.0 in | Wt 146.0 lb

## 2020-09-14 DIAGNOSIS — M2550 Pain in unspecified joint: Secondary | ICD-10-CM

## 2020-09-14 DIAGNOSIS — Z7689 Persons encountering health services in other specified circumstances: Secondary | ICD-10-CM

## 2020-09-14 DIAGNOSIS — G8929 Other chronic pain: Secondary | ICD-10-CM | POA: Insufficient documentation

## 2020-09-14 LAB — CBC WITH DIFFERENTIAL/PLATELET
Basophils Absolute: 0 10*3/uL (ref 0.0–0.1)
Basophils Relative: 0.5 % (ref 0.0–3.0)
Eosinophils Absolute: 0.1 10*3/uL (ref 0.0–0.7)
Eosinophils Relative: 1.2 % (ref 0.0–5.0)
HCT: 39.6 % (ref 36.0–46.0)
Hemoglobin: 13.2 g/dL (ref 12.0–15.0)
Lymphocytes Relative: 20.2 % (ref 12.0–46.0)
Lymphs Abs: 1.2 10*3/uL (ref 0.7–4.0)
MCHC: 33.4 g/dL (ref 30.0–36.0)
MCV: 76.9 fl — ABNORMAL LOW (ref 78.0–100.0)
Monocytes Absolute: 0.4 10*3/uL (ref 0.1–1.0)
Monocytes Relative: 6.1 % (ref 3.0–12.0)
Neutro Abs: 4.2 10*3/uL (ref 1.4–7.7)
Neutrophils Relative %: 72 % (ref 43.0–77.0)
Platelets: 211 10*3/uL (ref 150.0–400.0)
RBC: 5.15 Mil/uL — ABNORMAL HIGH (ref 3.87–5.11)
RDW: 13.9 % (ref 11.5–15.5)
WBC: 5.9 10*3/uL (ref 4.0–10.5)

## 2020-09-14 LAB — COMPREHENSIVE METABOLIC PANEL
ALT: 16 U/L (ref 0–35)
AST: 19 U/L (ref 0–37)
Albumin: 4.3 g/dL (ref 3.5–5.2)
Alkaline Phosphatase: 56 U/L (ref 39–117)
BUN: 15 mg/dL (ref 6–23)
CO2: 30 mEq/L (ref 19–32)
Calcium: 8.8 mg/dL (ref 8.4–10.5)
Chloride: 102 mEq/L (ref 96–112)
Creatinine, Ser: 0.89 mg/dL (ref 0.40–1.20)
GFR: 75 mL/min (ref >60.00)
Glucose, Bld: 115 mg/dL — ABNORMAL HIGH (ref 70–99)
Potassium: 4.1 mEq/L (ref 3.5–5.1)
Sodium: 139 mEq/L (ref 135–145)
Total Bilirubin: 0.4 mg/dL (ref 0.2–1.2)
Total Protein: 7.9 g/dL (ref 6.0–8.3)

## 2020-09-14 LAB — LIPID PANEL
Cholesterol: 216 mg/dL — ABNORMAL HIGH (ref 0–200)
HDL: 74.7 mg/dL (ref >39.00)
LDL Cholesterol: 129 mg/dL — ABNORMAL HIGH (ref 0–99)
NonHDL: 141.43
Total CHOL/HDL Ratio: 3
Triglycerides: 64 mg/dL (ref 0.0–149.0)
VLDL: 12.8 mg/dL (ref 0.0–40.0)

## 2020-09-14 LAB — TSH: TSH: 1.3 u[IU]/mL (ref 0.35–5.50)

## 2020-09-14 LAB — FERRITIN: Ferritin: 87.1 ng/mL (ref 10.0–291.0)

## 2020-09-14 LAB — SEDIMENTATION RATE: Sed Rate: 36 mm/hr — ABNORMAL HIGH (ref 0–30)

## 2020-09-14 LAB — HEMOGLOBIN A1C: Hgb A1c MFr Bld: 5.6 % (ref 4.6–6.5)

## 2020-09-14 NOTE — Patient Instructions (Signed)
Health Maintenance, Female Adopting a healthy lifestyle and getting preventive care are important in promoting health and wellness. Ask your health care provider about: The right schedule for you to have regular tests and exams. Things you can do on your own to prevent diseases and keep yourself healthy. What should I know about diet, weight, and exercise? Eat a healthy diet  Eat a diet that includes plenty of vegetables, fruits, low-fat dairy products, and lean protein. Do not eat a lot of foods that are high in solid fats, added sugars, or sodium.  Maintain a healthy weight Body mass index (BMI) is used to identify weight problems. It estimates body fat based on height and weight. Your health care provider can help determineyour BMI and help you achieve or maintain a healthy weight. Get regular exercise Get regular exercise. This is one of the most important things you can do for your health. Most adults should: Exercise for at least 150 minutes each week. The exercise should increase your heart rate and make you sweat (moderate-intensity exercise). Do strengthening exercises at least twice a week. This is in addition to the moderate-intensity exercise. Spend less time sitting. Even light physical activity can be beneficial. Watch cholesterol and blood lipids Have your blood tested for lipids and cholesterol at 51 years of age, then havethis test every 5 years. Have your cholesterol levels checked more often if: Your lipid or cholesterol levels are high. You are older than 51 years of age. You are at high risk for heart disease. What should I know about cancer screening? Depending on your health history and family history, you may need to have cancer screening at various ages. This may include screening for: Breast cancer. Cervical cancer. Colorectal cancer. Skin cancer. Lung cancer. What should I know about heart disease, diabetes, and high blood pressure? Blood pressure and heart  disease High blood pressure causes heart disease and increases the risk of stroke. This is more likely to develop in people who have high blood pressure readings, are of African descent, or are overweight. Have your blood pressure checked: Every 3-5 years if you are 18-39 years of age. Every year if you are 40 years old or older. Diabetes Have regular diabetes screenings. This checks your fasting blood sugar level. Have the screening done: Once every three years after age 40 if you are at a normal weight and have a low risk for diabetes. More often and at a younger age if you are overweight or have a high risk for diabetes. What should I know about preventing infection? Hepatitis B If you have a higher risk for hepatitis B, you should be screened for this virus. Talk with your health care provider to find out if you are at risk forhepatitis B infection. Hepatitis C Testing is recommended for: Everyone born from 1945 through 1965. Anyone with known risk factors for hepatitis C. Sexually transmitted infections (STIs) Get screened for STIs, including gonorrhea and chlamydia, if: You are sexually active and are younger than 51 years of age. You are older than 51 years of age and your health care provider tells you that you are at risk for this type of infection. Your sexual activity has changed since you were last screened, and you are at increased risk for chlamydia or gonorrhea. Ask your health care provider if you are at risk. Ask your health care provider about whether you are at high risk for HIV. Your health care provider may recommend a prescription medicine to help   prevent HIV infection. If you choose to take medicine to prevent HIV, you should first get tested for HIV. You should then be tested every 3 months for as long as you are taking the medicine. Pregnancy If you are about to stop having your period (premenopausal) and you may become pregnant, seek counseling before you get  pregnant. Take 400 to 800 micrograms (mcg) of folic acid every day if you become pregnant. Ask for birth control (contraception) if you want to prevent pregnancy. Osteoporosis and menopause Osteoporosis is a disease in which the bones lose minerals and strength with aging. This can result in bone fractures. If you are 65 years old or older, or if you are at risk for osteoporosis and fractures, ask your health care provider if you should: Be screened for bone loss. Take a calcium or vitamin D supplement to lower your risk of fractures. Be given hormone replacement therapy (HRT) to treat symptoms of menopause. Follow these instructions at home: Lifestyle Do not use any products that contain nicotine or tobacco, such as cigarettes, e-cigarettes, and chewing tobacco. If you need help quitting, ask your health care provider. Do not use street drugs. Do not share needles. Ask your health care provider for help if you need support or information about quitting drugs. Alcohol use Do not drink alcohol if: Your health care provider tells you not to drink. You are pregnant, may be pregnant, or are planning to become pregnant. If you drink alcohol: Limit how much you use to 0-1 drink a day. Limit intake if you are breastfeeding. Be aware of how much alcohol is in your drink. In the [place] one drink equals one 12 oz bottle of beer (355 mL), one 5 oz glass of wine (148 mL), or one 1 oz glass of hard liquor (44 mL). General instructions Schedule regular health, dental, and eye exams. Stay current with your vaccines. Tell your health care provider if: You often feel depressed. You have ever been abused or do not feel safe at home. Summary Adopting a healthy lifestyle and getting preventive care are important in promoting health and wellness. Follow your health care provider's instructions about healthy diet, exercising, and getting tested or screened for diseases. Follow your health care provider's  instructions on monitoring your cholesterol and blood pressure. This information is not intended to replace advice given to you by your health care provider. Make sure you discuss any questions you have with your healthcare provider. Document Revised: 02/06/2018 Document Reviewed: 02/06/2018 Elsevier Patient Education  2022 Elsevier Inc.  
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
## 2020-09-16 LAB — ANA,IFA RA DIAG PNL W/RFLX TIT/PATN
Anti Nuclear Antibody (ANA): NEGATIVE
Cyclic Citrullin Peptide Ab: <16 UNITS
Rhuematoid fact SerPl-aCnc: 84 IU/mL — ABNORMAL HIGH (ref <14)

## 2020-09-16 NOTE — Progress Notes (Signed)
Call patient please.  Rheumatology blood work positive for rheumatoid factor.  She probably has rheumatoid arthritis.  Patient was referred to rheumatologist.  Make sure she follows up with the rheumatologist.
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
## 2020-11-02 ENCOUNTER — Ambulatory Visit: Payer: Self-pay

## 2020-11-02 ENCOUNTER — Other Ambulatory Visit: Payer: Self-pay

## 2020-11-02 ENCOUNTER — Encounter: Payer: Self-pay | Admitting: Internal Medicine

## 2020-11-02 ENCOUNTER — Ambulatory Visit (INDEPENDENT_AMBULATORY_CARE_PROVIDER_SITE_OTHER): Payer: BC Managed Care – PPO | Admitting: Internal Medicine

## 2020-11-02 ENCOUNTER — Encounter: Payer: Self-pay | Admitting: *Deleted

## 2020-11-02 VITALS — BP 131/88 | HR 80 | Resp 15 | Ht 63.0 in | Wt 146.2 lb

## 2020-11-02 DIAGNOSIS — M25512 Pain in left shoulder: Secondary | ICD-10-CM | POA: Diagnosis not present

## 2020-11-02 DIAGNOSIS — M5459 Other low back pain: Secondary | ICD-10-CM | POA: Diagnosis not present

## 2020-11-02 DIAGNOSIS — M545 Low back pain, unspecified: Secondary | ICD-10-CM | POA: Insufficient documentation

## 2020-11-02 DIAGNOSIS — G8929 Other chronic pain: Secondary | ICD-10-CM

## 2020-11-02 DIAGNOSIS — R768 Other specified abnormal immunological findings in serum: Secondary | ICD-10-CM | POA: Insufficient documentation

## 2020-11-04 ENCOUNTER — Ambulatory Visit: Payer: Self-pay | Admitting: Internal Medicine

## 2020-11-30 ENCOUNTER — Ambulatory Visit (INDEPENDENT_AMBULATORY_CARE_PROVIDER_SITE_OTHER): Payer: BC Managed Care – PPO | Admitting: Internal Medicine

## 2020-11-30 ENCOUNTER — Encounter: Payer: Self-pay | Admitting: Internal Medicine

## 2020-11-30 ENCOUNTER — Other Ambulatory Visit: Payer: Self-pay

## 2020-11-30 VITALS — BP 96/68 | HR 79 | Resp 15 | Ht 62.0 in | Wt 145.0 lb

## 2020-11-30 DIAGNOSIS — R768 Other specified abnormal immunological findings in serum: Secondary | ICD-10-CM

## 2020-11-30 DIAGNOSIS — M545 Low back pain, unspecified: Secondary | ICD-10-CM | POA: Diagnosis not present

## 2020-11-30 DIAGNOSIS — M25512 Pain in left shoulder: Secondary | ICD-10-CM

## 2020-11-30 DIAGNOSIS — G8929 Other chronic pain: Secondary | ICD-10-CM
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
## 2021-01-26 ENCOUNTER — Ambulatory Visit: Payer: BC Managed Care – PPO | Attending: Internal Medicine | Admitting: Physical Therapy

## 2021-01-26 ENCOUNTER — Encounter: Payer: Self-pay | Admitting: Physical Therapy

## 2021-01-26 ENCOUNTER — Other Ambulatory Visit: Payer: Self-pay

## 2021-01-26 DIAGNOSIS — M542 Cervicalgia: Secondary | ICD-10-CM | POA: Insufficient documentation

## 2021-01-26 DIAGNOSIS — M25511 Pain in right shoulder: Secondary | ICD-10-CM | POA: Insufficient documentation

## 2021-01-26 DIAGNOSIS — G8929 Other chronic pain: Secondary | ICD-10-CM | POA: Diagnosis not present

## 2021-01-26 DIAGNOSIS — M25512 Pain in left shoulder: Secondary | ICD-10-CM | POA: Diagnosis not present

## 2021-01-26 DIAGNOSIS — M545 Low back pain, unspecified: Secondary | ICD-10-CM | POA: Diagnosis not present

## 2021-01-26 NOTE — Patient Instructions (Signed)
Access Code: [email or web address] URL: [email or web address] Date: 01/26/2021 Prepared by: Stacie Glaze  Exercises Seated Shoulder Shrugs - 2 x daily - 7 x weekly - 1 sets - 10 reps - 3 hold Seated Scapular Retraction - 2 x daily - 7 x weekly - 1 sets - 10 reps - 3 hold Seated Cervical Rotation AROM - 2 x daily - 7 x weekly - 1 sets - 10 reps - 3 hold Cervical AROM Flexion and Rotation - 2 x daily - 7 x weekly - 1 sets - 10 reps - 5 hold
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
## 2021-12-19 ENCOUNTER — Emergency Department (HOSPITAL_COMMUNITY)
Admission: EM | Admit: 2021-12-19 | Discharge: 2021-12-20 | Discharge status: Against Medical Advice | Payer: Self-pay | Attending: Emergency Medicine | Admitting: Emergency Medicine

## 2021-12-19 ENCOUNTER — Encounter (HOSPITAL_COMMUNITY): Payer: Self-pay

## 2021-12-19 ENCOUNTER — Emergency Department (HOSPITAL_COMMUNITY): Payer: Self-pay

## 2021-12-19 DIAGNOSIS — R200 Anesthesia of skin: Secondary | ICD-10-CM | POA: Insufficient documentation

## 2021-12-19 DIAGNOSIS — Z5321 Procedure and treatment not carried out due to patient leaving prior to being seen by health care provider: Secondary | ICD-10-CM | POA: Insufficient documentation

## 2021-12-19 DIAGNOSIS — M25511 Pain in right shoulder: Secondary | ICD-10-CM | POA: Insufficient documentation

## 2021-12-19 NOTE — ED Triage Notes (Signed)
Difficulty during triage reaching appropriate interpreter. Triage assisted by daughter while waiting on [employer] interpreter.   Pt c/o R shoulder pain for one week. Pt denies known injury/trauma. Endorses numbness to her forearm.

## 2021-12-20 NOTE — ED Notes (Signed)
Patient states the wait is long and she is leaving 

## 2023-02-14 ENCOUNTER — Ambulatory Visit: Payer: Medicaid Other | Admitting: Internal Medicine
